# Patient Record
Sex: Female | Born: 1995 | Race: White | Hispanic: No | Smoking: Former smoker
Health system: Southern US, Community
[De-identification: ages and names within clinical notes are randomized; demographics above are authoritative.]

## PROBLEM LIST (undated history)

## (undated) ENCOUNTER — Inpatient Hospital Stay: Payer: Self-pay

## (undated) DIAGNOSIS — R638 Other symptoms and signs concerning food and fluid intake: Secondary | ICD-10-CM

## (undated) DIAGNOSIS — Z8279 Family history of other congenital malformations, deformations and chromosomal abnormalities: Secondary | ICD-10-CM

## (undated) DIAGNOSIS — F419 Anxiety disorder, unspecified: Secondary | ICD-10-CM

## (undated) DIAGNOSIS — F329 Major depressive disorder, single episode, unspecified: Secondary | ICD-10-CM

## (undated) DIAGNOSIS — K589 Irritable bowel syndrome without diarrhea: Secondary | ICD-10-CM

## (undated) DIAGNOSIS — F32A Depression, unspecified: Secondary | ICD-10-CM

## (undated) HISTORY — DX: Major depressive disorder, single episode, unspecified: F32.9

## (undated) HISTORY — DX: Irritable bowel syndrome, unspecified: K58.9

## (undated) HISTORY — DX: Depression, unspecified: F32.A

## (undated) HISTORY — DX: Anxiety disorder, unspecified: F41.9

## (undated) HISTORY — PX: NO PAST SURGERIES: SHX2092

## (undated) HISTORY — DX: Other symptoms and signs concerning food and fluid intake: R63.8

## (undated) HISTORY — DX: Family history of other congenital malformations, deformations and chromosomal abnormalities: Z82.79

## (undated) HISTORY — PX: OTHER SURGICAL HISTORY: SHX169

---
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
## 2013-02-21 ENCOUNTER — Emergency Department: Payer: Self-pay | Admitting: Internal Medicine

## 2013-02-21 LAB — URINALYSIS, COMPLETE
BLOOD: NEGATIVE
Bilirubin,UR: NEGATIVE
Glucose,UR: NEGATIVE mg/dL (ref 0–75)
Ketone: NEGATIVE
Nitrite: NEGATIVE
PROTEIN: NEGATIVE
Ph: 5 (ref 4.5–8.0)
RBC,UR: <1 /HPF (ref 0–5)
Specific Gravity: 1.027 (ref 1.003–1.030)
Squamous Epithelial: 2
WBC UR: 1 /HPF (ref 0–5)

## 2013-02-21 LAB — WET PREP, GENITAL

## 2013-02-21 LAB — PREGNANCY, URINE: Pregnancy Test, Urine: NEGATIVE m[IU]/mL

## 2013-02-27 LAB — HM PAP SMEAR: HM Pap smear: NEGATIVE

## 2014-06-18 NOTE — H&P (Signed)
L&D Evaluation:  History:  HPI 19 yo swf G2P0010 EGA 38.4 weeks admitted in labor with subsequent spontaneous rupture of membranes - clear.   Patient's Medical History IBS; Increased BMI   Patient's Surgical History none   Medications Pre Natal Vitamins  Tylenol (Acetaminophen)   Allergies NKDA   Social History none   Family History Non-Contributory   ROS:  ROS All systems were reviewed.  HEENT, CNS, GI, GU, Respiratory, CV, Renal and Musculoskeletal systems were found to be normal.   Exam:  Vital Signs stable   General no apparent distress   Mental Status clear   Abdomen gravid, non-tender   Estimated Fetal Weight Average for gestational age   Back no CVAT   Edema no edema   Pelvic no external lesions, 7/90/-1/VTX   Mebranes Ruptured   Description clear   FHT Description Variable decelerations   Ucx irregular   Skin dry   Lymph no lymphadenopathy   Other O+/Atb-/NR/RI/VI/HB-/HIV-/Glucola 96/GBS+   Impression:  Impression early labor   Plan:  Plan antibiotics for GBBS prophylaxis, fluids, FSE/IUPC   Electronic Signatures: Babe Anthis, Prentice DockerMartin A (MD)  (Signed 07-Jun-14 08:52)  Authored: L&D Evaluation   Last Updated: 07-Jun-14 08:52 by Pebbles Zeiders, Prentice DockerMartin A (MD)

## 2014-10-03 ENCOUNTER — Encounter: Payer: Self-pay | Admitting: Obstetrics and Gynecology

## 2014-10-03 ENCOUNTER — Ambulatory Visit (INDEPENDENT_AMBULATORY_CARE_PROVIDER_SITE_OTHER): Payer: Medicaid Other | Admitting: Obstetrics and Gynecology

## 2014-10-03 VITALS — BP 113/81 | HR 92 | Ht 61.0 in | Wt 186.4 lb

## 2014-10-03 DIAGNOSIS — R309 Painful micturition, unspecified: Secondary | ICD-10-CM | POA: Diagnosis not present

## 2014-10-03 DIAGNOSIS — N898 Other specified noninflammatory disorders of vagina: Secondary | ICD-10-CM | POA: Diagnosis not present

## 2014-10-03 DIAGNOSIS — N760 Acute vaginitis: Secondary | ICD-10-CM | POA: Insufficient documentation

## 2014-10-03 LAB — POCT URINALYSIS DIPSTICK
Bilirubin, UA: NEGATIVE
Blood, UA: NEGATIVE
Glucose, UA: NEGATIVE
Ketones, UA: NEGATIVE
LEUKOCYTES UA: NEGATIVE
Nitrite, UA: NEGATIVE
PROTEIN UA: NEGATIVE
SPEC GRAV UA: 1.025
Urobilinogen, UA: NEGATIVE
pH, UA: 6

## 2014-10-03 MED ORDER — FLUCONAZOLE 150 MG PO TABS: 150.0000 mg | ORAL_TABLET | Freq: Once | ORAL | Status: DC

## 2014-10-03 NOTE — Progress Notes (Signed)
Patient ID: Angela Mcgrath, female   DOB: 1995/04/06, 19 y.o.   MRN: [id number]  Chief complaint: 1.  Vaginal discharge.    Pt c/o vaginal discharge, white chunky, foul smelling and had red, raw labia, clitoris which has resolved. No recent new sex partners. No itching or odor. No recent antibiotics.  Past medical history, past surgical history, problem list, medications, and allergies are reviewed and updated.  Review of systems: Per HPI.  OBJECTIVE: BP 113/81 mmHg  Pulse 92  Ht  (1.549 m)  Wt 186 lb 7 oz (84.567 kg)  BMI 35.25 kg/m2  LMP 09/22/2014 (Approximate) Well-appearing white female in no acute distress. Abdomen: Soft, nontender. Pelvic exam: External genitalia-normal BUS-normal Vagina-white discharge present; no malodor. Cervix-no lesions Uterus-not palpated. Adnexa-not palpated. Rectovaginal-normal.  External exam  Procedure: Wet prep. KOH-negative for hyphae. Normal saline-negative for clue cells, white blood cells, Trichomonas.  IMPRESSION: 1.  Vaginitis, probably monilial, uncertain wet prep.  PLAN: 1. Diflucan 150 mg by mouth 1 2.  Return as needed if symptoms persist  Herold Harms, MD

## 2015-01-27 ENCOUNTER — Encounter: Payer: Self-pay | Admitting: *Deleted

## 2015-01-27 ENCOUNTER — Ambulatory Visit
Admission: EM | Admit: 2015-01-27 | Discharge: 2015-01-27 | Disposition: A | Discharge status: Home / Self Care | Payer: Medicaid Other | Attending: Emergency Medicine | Admitting: Emergency Medicine

## 2015-01-27 DIAGNOSIS — H6501 Acute serous otitis media, right ear: Secondary | ICD-10-CM

## 2015-01-27 DIAGNOSIS — J0140 Acute pansinusitis, unspecified: Secondary | ICD-10-CM | POA: Diagnosis not present

## 2015-01-27 DIAGNOSIS — H66001 Acute suppurative otitis media without spontaneous rupture of ear drum, right ear: Secondary | ICD-10-CM

## 2015-01-27 MED ORDER — IBUPROFEN 800 MG PO TABS: 800.0000 mg | ORAL_TABLET | Freq: Three times a day (TID) | ORAL | Status: DC | PRN

## 2015-01-27 MED ORDER — AMOXICILLIN-POT CLAVULANATE 875-125 MG PO TABS: 1.0000 | ORAL_TABLET | Freq: Two times a day (BID) | ORAL | Status: DC

## 2015-01-27 MED ORDER — PSEUDOEPHEDRINE-GUAIFENESIN ER 120-1200 MG PO TB12: 1.0000 | ORAL_TABLET | Freq: Two times a day (BID) | ORAL | Status: DC

## 2015-01-27 MED ORDER — MOMETASONE FUROATE 50 MCG/ACT NA SUSP: 2.0000 | Freq: Every day | NASAL | Status: DC

## 2015-01-27 NOTE — ED Provider Notes (Signed)
HPI  SUBJECTIVE:  Angela Mcgrath is a 19 y.o. female who presents with nasal congestion for the past 2 weeks. She states that her upper teeth hurt. She reports a frontal headache which is worse with bending forward, lying down. She also reports right ear pain and inability to hear out of right ear this morning. She reports sore throat, postnasal drip. There are no aggravating or alleviating factors. She tried DayQuil times one several days ago, none since. She denies sinus pain/pressure, otorrhea, foreign body insertion into her ear. She denies ear pain with chewing, popping or cracking in her ear. No nausea, vomiting, fevers. No current nasal drainage. No allergy type symptoms. Past medical history negative for allergies, diabetes, sinusitis, hypertension, otitis media. LMP now.    Past Medical History  Diagnosis Date  . Increased BMI   . IBS (irritable bowel syndrome)   . Family history of Downs syndrome     FOB-brother deceased    Past Surgical History  Procedure Laterality Date  . None      Family History  Problem Relation Age of Onset  . Heart attack Mother     4340's  . COPD Father   . Asthma Father   . Diabetes Maternal Grandmother     Social History  Substance Use Topics  . Smoking status: Former Games developermoker  . Smokeless tobacco: Never Used  . Alcohol Use: 0.0 oz/week    0 Standard drinks or equivalent per week    No current facility-administered medications for this encounter.  Current outpatient prescriptions:  .  amoxicillin-clavulanate (AUGMENTIN) 875-125 MG tablet, Take 1 tablet by mouth 2 (two) times daily. X 10 days, Disp: 20 tablet, Rfl: 0 .  ibuprofen (ADVIL,MOTRIN) 800 MG tablet, Take 1 tablet (800 mg total) by mouth every 8 (eight) hours as needed., Disp: 30 tablet, Rfl: 0 .  mometasone (NASONEX) 50 MCG/ACT nasal spray, Place 2 sprays into the nose daily., Disp: 17 g, Rfl: 0 .  Pseudoephedrine-Guaifenesin (MUCINEX D) 513 200 2480 MG TB12, Take 1 tablet by mouth  2 (two) times daily., Disp: 20 each, Rfl: 0  No Known Allergies   ROS  As noted in HPI.   Physical Exam  BP 111/70 mmHg  Pulse 106  Temp(Src) 99 F (37.2 C) (Oral)  Resp 20  Ht 5\' 1"  (1.549 m)  Wt 170 lb (77.111 kg)  BMI 32.14 kg/m2  SpO2 100%  LMP 01/24/2015  Constitutional: Well developed, well nourished, no acute distress Eyes: PERRL, EOMI, conjunctiva normal bilaterally HENT: Normocephalic, atraumatic,mucus membranes moist. + dull, bulging, erythematous right TM. Positive nasal congestion, swollen, erythematous turbinates, bilateral frontal and maxillary sinus tenderness. Normal oropharynx. Dentition within normal limits. Respiratory: Clear to auscultation bilaterally, no rales, no wheezing, no rhonchi Cardiovascular: Mild tachycardia, no murmurs, no gallops, no rubs GI:  nondistended, Lymph: No cervical lymphadenopathy skin: No rash, skin intact Musculoskeletal: No edema, no tenderness, no deformities Neurologic: Alert & oriented x 3, CN II-XII grossly intact, no motor deficits, sensation grossly intact Psychiatric: Speech and behavior appropriate   ED Course   Medications - No data to display  No orders of the defined types were placed in this encounter.   No results found for this or any previous visit (from the past 24 hour(s)). No results found.  ED Clinical Impression  Acute suppurative otitis media of right ear without spontaneous rupture of tympanic membrane, recurrence not specified  Acute pansinusitis, recurrence not specified   ED Assessment/Plan  LMP now. Patient denies possibility of  being pregnant. Patient with right otitis, also sinusitis 2 weeks duration. Home with Augmentin, Mucinex D, Nasonex, ibuprofen 800 mg, increase fluids. Follow-up with PMD as needed, return here if gets worse.  Discussed  MDM, plan and followup with patient. Discussed sn/sx that should prompt return to the UC or ED. Patient  agrees with plan.  *This clinic note  was created using Dragon dictation software. Therefore, there may be occasional mistakes despite careful proofreading.  ?  Domenick Gong, MD 01/28/15 [phone]

## 2015-01-27 NOTE — ED Notes (Signed)
Patient has had sinus pressure and drainage for 2 weeks and awoke this AM to right side ear pain. She does have some minimal left ear pain. No other symptoms reported.

## 2015-01-27 NOTE — Discharge Instructions (Signed)
Take the medication as written. Return if you get worse, have a fever >100.4, or for any concerns.  Use a neti pot or the NeilMed sinus rinse as often as you want to to reduce nasal congestion. Follow the directions on the box.   Go to www.goodrx.com to look up your medications. This will give you a list of where you can find your prescriptions at the most affordable prices.

## 2015-07-16 ENCOUNTER — Ambulatory Visit (INDEPENDENT_AMBULATORY_CARE_PROVIDER_SITE_OTHER): Payer: Medicaid Other | Admitting: Obstetrics and Gynecology

## 2015-07-16 ENCOUNTER — Encounter: Payer: Self-pay | Admitting: Obstetrics and Gynecology

## 2015-07-16 VITALS — BP 114/72 | HR 93 | Ht 61.0 in | Wt 196.0 lb

## 2015-07-16 DIAGNOSIS — Z0000 Encounter for general adult medical examination without abnormal findings: Secondary | ICD-10-CM | POA: Diagnosis not present

## 2015-07-16 DIAGNOSIS — Z30011 Encounter for initial prescription of contraceptive pills: Secondary | ICD-10-CM

## 2015-07-16 DIAGNOSIS — R638 Other symptoms and signs concerning food and fluid intake: Secondary | ICD-10-CM

## 2015-07-16 DIAGNOSIS — N946 Dysmenorrhea, unspecified: Secondary | ICD-10-CM

## 2015-07-16 DIAGNOSIS — N926 Irregular menstruation, unspecified: Secondary | ICD-10-CM | POA: Diagnosis not present

## 2015-07-16 DIAGNOSIS — Z01419 Encounter for gynecological examination (general) (routine) without abnormal findings: Secondary | ICD-10-CM

## 2015-07-16 LAB — POCT URINE PREGNANCY: Preg Test, Ur: NEGATIVE

## 2015-07-16 MED ORDER — IBUPROFEN 800 MG PO TABS: 800.0000 mg | ORAL_TABLET | Freq: Three times a day (TID) | ORAL | Status: DC | PRN

## 2015-07-16 MED ORDER — NORETHINDRONE ACET-ETHINYL EST 1-20 MG-MCG PO TABS: 1.0000 | ORAL_TABLET | Freq: Every day | ORAL | Status: DC

## 2015-07-16 NOTE — Patient Instructions (Signed)
1. Pap smear is done 2. Self breast exam is encouraged 3. Healthy eating and exercise with weight loss is encouraged 4. Ibuprofen 800 mg 3 times a day is written for dysmenorrhea 5. Microgestin FE 1/20 oral contraceptive is written 6. Return in 1 year

## 2015-07-16 NOTE — Addendum Note (Signed)
Addended by: Marchelle FolksMILLER, Kylani Wires G on: 07/16/2015 03:18 PM   Modules accepted: Orders

## 2015-07-16 NOTE — Progress Notes (Signed)
Patient ID: Angela Mcgrath, female   DOB: December 18, 1995, 20 y.o.   MRN: [id number] ANNUAL PREVENTATIVE CARE GYN  ENCOUNTER NOTE  Subjective:       Angela Mcgrath is a 20 y.o. G39P1011 female here for a routine annual gynecologic exam.  Current complaints: 1.  Cycle 2 weeks late-Urine pregnancy test is negative  2. Yes  wants refill of ibup    Gynecologic History Patient's last menstrual period was 06/03/2015 (exact date). Contraception: none Last Pap: 02/2013. Results were: normal Last mammogram: n/a. Results were: N/A  Obstetric History OB History  Gravida Para Term Preterm AB SAB TAB Ectopic Multiple Living  # Outcome Date GA Lbr Len/2nd Weight Sex Delivery Anes PTL Lv  2 Term 07/15/12    F Vag-Spont   Y  1 SAB 07/2011        FD      Past Medical History  Diagnosis Date  . Increased BMI   . IBS (irritable bowel syndrome)   . Family history of Downs syndrome     FOB-brother deceased  . Anxiety   . Depression     Past Surgical History  Procedure Laterality Date  . None    . No past surgeries      Current Outpatient Prescriptions on File Prior to Visit  Medication Sig Dispense Refill  . ibuprofen (ADVIL,MOTRIN) 800 MG tablet Take 1 tablet (800 mg total) by mouth every 8 (eight) hours as needed. 30 tablet 0   No current facility-administered medications on file prior to visit.    No Known Allergies  Social History   Social History  . Marital Status: Single    Spouse Name: N/A  . Number of Children: N/A  . Years of Education: N/A   Occupational History  . Not on file.   Social History Main Topics  . Smoking status: Former Games developer  . Smokeless tobacco: Never Used  . Alcohol Use: No  . Drug Use: No  . Sexual Activity:    Partners: Male    Birth Control/ Protection: None   Other Topics Concern  . Not on file   Social History Narrative    Family History  Problem Relation Age of Onset  . Heart attack Mother     39's  . Diabetes  Mother   . COPD Father   . Asthma Father   . Diabetes Maternal Grandmother   . Heart disease Maternal Grandmother   . Diabetes Maternal Grandfather   . Colon cancer Neg Hx   . Ovarian cancer Neg Hx   . Breast cancer Neg Hx     The following portions of the patient's history were reviewed and updated as appropriate: allergies, current medications, past family history, past medical history, past social history, past surgical history and problem list.  Review of Systems ROS Review of Systems - General ROS: negative for - chills, fatigue, fever, hot flashes, night sweats, weight gain or weight loss Psychological ROS: negative for - anxiety, decreased libido, depression, mood swings, physical abuse or sexual abuse Ophthalmic ROS: negative for - blurry vision, eye pain or loss of vision ENT ROS: negative for - headaches, hearing change, visual changes or vocal changes Allergy and Immunology ROS: negative for - hives, itchy/watery eyes or seasonal allergies Hematological and Lymphatic ROS: negative for - bleeding problems, bruising, swollen lymph nodes or weight loss Endocrine ROS: negative for - galactorrhea, hair pattern changes, hot  flashes, malaise/lethargy, mood swings, palpitations, polydipsia/polyuria, skin changes, temperature intolerance or unexpected weight changes Breast ROS: negative for - new or changing breast lumps or nipple discharge Respiratory ROS: negative for - cough or shortness of breath Cardiovascular ROS: negative for - chest pain, irregular heartbeat, palpitations or shortness of breath Gastrointestinal ROS: no abdominal pain, change in bowel habits, or black or bloody stools Genito-Urinary ROS: no dysuria, trouble voiding, or hematuria Musculoskeletal ROS: negative for - joint pain or joint stiffness Neurological ROS: negative for - bowel and bladder control changes Dermatological ROS: negative for rash and skin lesion changes   Objective:   BP 114/72 mmHg  Pulse  93  Ht 5\' 1"  (1.549 m)  Wt 196 lb (88.905 kg)  BMI 37.05 kg/m2  LMP 06/03/2015 (Exact Date) CONSTITUTIONAL: Well-developed, well-nourished female in no acute distress.  PSYCHIATRIC: Normal mood and affect. Normal behavior. Normal judgment and thought content. NEUROLGIC: Alert and oriented to person, place, and time. Normal muscle tone coordination. No cranial nerve deficit noted. HENT:  Normocephalic, atraumatic, External right and left ear normal. Oropharynx is clear and moist EYES: Conjunctivae and EOM are normal. Pupils are equal, round, and reactive to light. No scleral icterus.  NECK: Normal range of motion, supple, no masses.  Normal thyroid.  SKIN: Skin is warm and dry. No rash noted. Not diaphoretic. No erythema. No pallor. CARDIOVASCULAR: Normal heart rate noted, regular rhythm, no murmur. RESPIRATORY: Clear to auscultation bilaterally. Effort and breath sounds normal, no problems with respiration noted. BREASTS: Symmetric in size. No masses, skin changes, nipple drainage, or lymphadenopathy. ABDOMEN: Soft, normal bowel sounds, no distention noted.  No tenderness, rebound or guarding.  BLADDER: Normal PELVIC:  External Genitalia: Normal  BUS: Normal  Vagina: Normal  Cervix: Normal  Uterus: Normal  Adnexa: Normal  RV: External Exam NormaI  MUSCULOSKELETAL: Normal range of motion. No tenderness.  No cyanosis, clubbing, or edema.  2+ distal pulses. LYMPHATIC: No Axillary, Supraclavicular, or Inguinal Adenopathy.    Assessment:   Annual gynecologic examination 20 y.o. Contraception: none patient given prescription for Microgestin FE 1/20 bmi- 37 Dysmenorrhea   Plan:  Pap: Pap, Reflex if ASCUS and GC/CT NAAT Mammogram: Not Indicated Stool Guaiac Testing:  Not Indicated Labs: vit d lipid a1c fbs tsh Routine preventative health maintenance measures emphasized: Exercise/Diet/Weight control, Tobacco Warnings, Alcohol/Substance use risks and Safe Sex Refill Motrin 800 mg 3  times a day Microgestin FE1/20 prescription given Return to Clinic - 1 Year   Crystal GraysonMiller, CMA  Herold HarmsMartin A Defrancesco, MD  Note: This dictation was prepared with Dragon dictation along with smaller phrase technology. Any transcriptional errors that result from this process are unintentional.

## 2015-07-21 LAB — PAP IG, CT-NG, RFX HPV ASCU
CHLAMYDIA, NUC. ACID AMP: NEGATIVE
Gonococcus by Nucleic Acid Amp: NEGATIVE
PAP Smear Comment: 0

## 2016-01-20 ENCOUNTER — Emergency Department
Admission: EM | Admit: 2016-01-20 | Discharge: 2016-01-20 | Disposition: A | Discharge status: Home / Self Care | Payer: Medicaid Other | Attending: Emergency Medicine | Admitting: Emergency Medicine

## 2016-01-20 DIAGNOSIS — Z791 Long term (current) use of non-steroidal anti-inflammatories (NSAID): Secondary | ICD-10-CM | POA: Insufficient documentation

## 2016-01-20 DIAGNOSIS — Z5321 Procedure and treatment not carried out due to patient leaving prior to being seen by health care provider: Secondary | ICD-10-CM | POA: Diagnosis not present

## 2016-01-20 DIAGNOSIS — M545 Low back pain: Secondary | ICD-10-CM | POA: Diagnosis present

## 2016-01-20 DIAGNOSIS — Z87891 Personal history of nicotine dependence: Secondary | ICD-10-CM | POA: Diagnosis not present

## 2016-01-20 NOTE — ED Triage Notes (Signed)
Pt presents to ED with c/o bilateral lower back pain x2 days without any known recent injury or trauma. Pt reports "achy" pain, without radiation. Pt denies any change in bowel or bladder habits. Pt denies any h/x of kidney stones., LMP currently.

## 2016-01-20 NOTE — ED Notes (Signed)
No answer when called from lobby 

## 2016-04-19 ENCOUNTER — Encounter: Payer: Self-pay | Admitting: Emergency Medicine

## 2016-04-19 ENCOUNTER — Emergency Department
Admission: EM | Admit: 2016-04-19 | Discharge: 2016-04-19 | Disposition: A | Discharge status: Home / Self Care | Payer: Medicaid Other | Attending: Emergency Medicine | Admitting: Emergency Medicine

## 2016-04-19 DIAGNOSIS — Z87891 Personal history of nicotine dependence: Secondary | ICD-10-CM | POA: Insufficient documentation

## 2016-04-19 DIAGNOSIS — J029 Acute pharyngitis, unspecified: Secondary | ICD-10-CM | POA: Insufficient documentation

## 2016-04-19 LAB — POCT RAPID STREP A: Streptococcus, Group A Screen (Direct): NEGATIVE

## 2016-04-19 MED ORDER — LIDOCAINE VISCOUS 2 % MT SOLN
15.0000 mL | Freq: Once | OROMUCOSAL | Status: AC
  Administered 2016-04-19: 15 mL via OROMUCOSAL
  Filled 2016-04-19: qty 15

## 2016-04-19 MED ORDER — IBUPROFEN 600 MG PO TABS
600.0000 mg | ORAL_TABLET | Freq: Once | ORAL | Status: AC
  Administered 2016-04-19: 600 mg via ORAL
  Filled 2016-04-19: qty 1

## 2016-04-19 MED ORDER — DEXAMETHASONE SODIUM PHOSPHATE 10 MG/ML IJ SOLN
INTRAMUSCULAR | Status: AC
  Filled 2016-04-19: qty 1

## 2016-04-19 MED ORDER — DEXAMETHASONE 10 MG/ML FOR PEDIATRIC ORAL USE
10.0000 mg | Freq: Once | INTRAMUSCULAR | Status: AC
  Administered 2016-04-19: 10 mg via ORAL

## 2016-04-19 NOTE — ED Provider Notes (Signed)
[facility] Emergency Department Provider Note   ____________________________________________   First MD Initiated Contact with Patient 04/19/16 0150     (approximate)  I have reviewed the triage vital signs and the nursing notes.   HISTORY  Chief Complaint Sore Throat    HPI Angela Mcgrath is a 21 y.o. female who comes into the hospital today complaining that her throat is swollen. She reports that it started about 1-2 days ago. She hasn't taken anything for the symptoms reports that sometimes her throat gets dry and she feels it is difficult to breathe. She reports that she felt that before she came in. The patient has not had any fever but has had some runny nose and cough. The patient denies any sick contacts and she has some occasional nausea with no vomiting. The patient has had some abdominal pain earlier but she reports that she thought it was due to her menstrual cycle. The patient denies any headache but rates her pain a 6 out of 10 in intensity. The patient's boyfriend reports that a month ago he was diagnosed with mono and was told that he was contagious for up to 18 months. The patient is here for evaluation.   Past Medical History:  Diagnosis Date  . Anxiety   . Depression   . Family history of Downs syndrome    FOB-brother deceased  . IBS (irritable bowel syndrome)   . Increased BMI     Patient Active Problem List   Diagnosis Date Noted  . Encounter for initial prescription of contraceptive pills 07/16/2015  . Dysmenorrhea 07/16/2015  . Missed period 07/16/2015  . Vaginitis 10/03/2014    Past Surgical History:  Procedure Laterality Date  . NO PAST SURGERIES    . none      Prior to Admission medications   Medication Sig Start Date End Date Taking? Authorizing Provider  ibuprofen (ADVIL,MOTRIN) 800 MG tablet Take 1 tablet (800 mg total) by mouth every 8 (eight) hours as needed. 07/16/15  Yes Herold HarmsMartin A Defrancesco, MD     Allergies Patient has no known allergies.  Family History  Problem Relation Age of Onset  . Heart attack Mother     740's  . Diabetes Mother   . COPD Father   . Asthma Father   . Diabetes Maternal Grandmother   . Heart disease Maternal Grandmother   . Diabetes Maternal Grandfather   . Colon cancer Neg Hx   . Ovarian cancer Neg Hx   . Breast cancer Neg Hx     Social History Social History  Substance Use Topics  . Smoking status: Former Games developermoker  . Smokeless tobacco: Never Used  . Alcohol use No    Review of Systems Constitutional: No fever/chills Eyes: No visual changes. ENT:  sore throat And runny nose Cardiovascular: Denies chest pain. Respiratory: Cough and shortness of breath Gastrointestinal: No abdominal pain.  No nausea, no vomiting.  No diarrhea.  No constipation. Genitourinary: Negative for dysuria. Musculoskeletal: Negative for back pain. Skin: Negative for rash. Neurological: Negative for headaches, focal weakness or numbness.  10-point ROS otherwise negative.  ____________________________________________   PHYSICAL EXAM:  VITAL SIGNS: ED Triage Vitals  Enc Vitals Group     BP 04/19/16 0111 (!) 143/71     Pulse Rate 04/19/16 0111 (!) 102     Resp 04/19/16 0111 18     Temp 04/19/16 0111 98.7 F (37.1 C)     Temp Source 04/19/16 0111 Oral  SpO2 04/19/16 0111 98 %     Weight 04/19/16 0112 178 lb (80.7 kg)     Height 04/19/16 0112 5' (1.524 m)     Head Circumference --      Peak Flow --      Pain Score 04/19/16 0114 6     Pain Loc --      Pain Edu? --      Excl. in GC? --     Constitutional: Alert and oriented. Well appearing and in Mild distress. Eyes: Conjunctivae are normal. PERRL. EOMI. Head: Atraumatic. Nose: No congestion/rhinnorhea. Mouth/Throat: Mucous membranes are moist.  Oropharynx non-erythematous. Lymphatic: no cervical lymphadenopathy Cardiovascular: Normal rate, regular rhythm. Grossly normal heart sounds.  Good  peripheral circulation. Respiratory: Normal respiratory effort.  No retractions. Lungs CTAB. Gastrointestinal: Soft and nontender. No distention. Active bowel sounds Musculoskeletal: No lower extremity tenderness nor edema.   Neurologic:  Normal speech and language.  Skin:  Skin is warm, dry and intact.  Psychiatric: Mood and affect are normal.   ____________________________________________   LABS (all labs ordered are listed, but only abnormal results are displayed)  Labs Reviewed  POCT RAPID STREP A   ____________________________________________  EKG  none ____________________________________________  RADIOLOGY  none ____________________________________________   PROCEDURES  Procedure(s) performed: None  Procedures  Critical Care performed: No  ____________________________________________   INITIAL IMPRESSION / ASSESSMENT AND PLAN / ED COURSE  Pertinent labs & imaging results that were available during my care of the patient were reviewed by me and considered in my medical decision making (see chart for details).  This is a 21 year old female who comes into the hospital today with a sore throat. The patient's strep test is negative. We'll send her strep swab for culture, we'll give the patient some dexamethasone, viscous lidocaine and ibuprofen. She'll be discharged home to follow-up with her primary care physician. The patient's boyfriend was concerned about mono. I informed him and her that it would be supportive care but I am willing to test her for mono. The patient reports that she does not want to be tested at this time since there is no medication that would help treat the symptoms. She will be discharged home.      ____________________________________________   FINAL CLINICAL IMPRESSION(S) / ED DIAGNOSES  Final diagnoses:  Sore throat  Viral pharyngitis      NEW MEDICATIONS STARTED DURING THIS VISIT:  New Prescriptions   No medications on  file     Note:  This document was prepared using Dragon voice recognition software and may include unintentional dictation errors.    Rebecka Apley, MD 04/19/16 [phone]

## 2016-04-19 NOTE — ED Triage Notes (Signed)
Pt reports cough and congestion with intermittent sore throat; says sometimes her throat is dry and sometimes it feels painful and swollen; pt talking in complete coherent sentences; able to swallow without difficulty

## 2016-04-21 ENCOUNTER — Encounter: Payer: Self-pay | Admitting: Medical Oncology

## 2016-04-21 ENCOUNTER — Emergency Department: Payer: Medicaid Other

## 2016-04-21 ENCOUNTER — Emergency Department
Admission: EM | Admit: 2016-04-21 | Discharge: 2016-04-21 | Disposition: A | Discharge status: Home / Self Care | Payer: Medicaid Other | Attending: Emergency Medicine | Admitting: Emergency Medicine

## 2016-04-21 DIAGNOSIS — Z87891 Personal history of nicotine dependence: Secondary | ICD-10-CM | POA: Diagnosis not present

## 2016-04-21 DIAGNOSIS — R109 Unspecified abdominal pain: Secondary | ICD-10-CM

## 2016-04-21 DIAGNOSIS — N3001 Acute cystitis with hematuria: Secondary | ICD-10-CM | POA: Insufficient documentation

## 2016-04-21 LAB — COMPREHENSIVE METABOLIC PANEL
ALBUMIN: 3.8 g/dL (ref 3.5–5.0)
ALK PHOS: 42 U/L (ref 38–126)
ALT: 11 U/L — ABNORMAL LOW (ref 14–54)
ANION GAP: 6 (ref 5–15)
AST: 17 U/L (ref 15–41)
BUN: 9 mg/dL (ref 6–20)
CALCIUM: 8.7 mg/dL — AB (ref 8.9–10.3)
CO2: 28 mmol/L (ref 22–32)
Chloride: 104 mmol/L (ref 101–111)
Creatinine, Ser: 0.54 mg/dL (ref 0.44–1.00)
GFR calc non Af Amer: >60 mL/min (ref >60)
GLUCOSE: 86 mg/dL (ref 65–99)
POTASSIUM: 4 mmol/L (ref 3.5–5.1)
SODIUM: 138 mmol/L (ref 135–145)
TOTAL PROTEIN: 7 g/dL (ref 6.5–8.1)
Total Bilirubin: 0.7 mg/dL (ref 0.3–1.2)

## 2016-04-21 LAB — URINALYSIS, COMPLETE (UACMP) WITH MICROSCOPIC
BACTERIA UA: NONE SEEN
Bilirubin Urine: NEGATIVE
Glucose, UA: NEGATIVE mg/dL
Ketones, ur: NEGATIVE mg/dL
NITRITE: NEGATIVE
Protein, ur: NEGATIVE mg/dL
SPECIFIC GRAVITY, URINE: 1.006 (ref 1.005–1.030)
pH: 7 (ref 5.0–8.0)

## 2016-04-21 LAB — CBC
HCT: 40.8 % (ref 35.0–47.0)
HEMOGLOBIN: 13.4 g/dL (ref 12.0–16.0)
MCH: 28.6 pg (ref 26.0–34.0)
MCHC: 32.9 g/dL (ref 32.0–36.0)
MCV: 86.9 fL (ref 80.0–100.0)
PLATELETS: 273 10*3/uL (ref 150–440)
RBC: 4.7 MIL/uL (ref 3.80–5.20)
RDW: 13.8 % (ref 11.5–14.5)
WBC: 11.7 10*3/uL — ABNORMAL HIGH (ref 3.6–11.0)

## 2016-04-21 LAB — LIPASE, BLOOD: Lipase: 11 U/L (ref 11–51)

## 2016-04-21 LAB — POCT PREGNANCY, URINE: PREG TEST UR: NEGATIVE

## 2016-04-21 MED ORDER — CEPHALEXIN 500 MG PO CAPS: 500.0000 mg | ORAL_CAPSULE | Freq: Two times a day (BID) | ORAL | 0 refills | Status: DC

## 2016-04-21 NOTE — ED Triage Notes (Signed)
Pt reports that she began having rt upper quad abd pain today around 1300. Pt denies nausea, vomiting. Pt denies fever.

## 2016-04-21 NOTE — ED Provider Notes (Signed)
[facility] Emergency Department Provider Note   ____________________________________________    I have reviewed the triage vital signs and the nursing notes.   HISTORY  Chief Complaint Abdominal Pain     HPI Angela Mcgrath is a 21 y.o. female who presents with complaints of right-sided abdominal pain which started earlier today. She complains of urinary frequency but no dysuria. No fevers or chills reported. No history of abdominal surgery. No vaginal discharge. Pain is cramping and intermittent, she has not taken anything for it. She has never had this pain before   Past Medical History:  Diagnosis Date  . Anxiety   . Depression   . Family history of Downs syndrome    FOB-brother deceased  . IBS (irritable bowel syndrome)   . Increased BMI     Patient Active Problem List   Diagnosis Date Noted  . Encounter for initial prescription of contraceptive pills 07/16/2015  . Dysmenorrhea 07/16/2015  . Missed period 07/16/2015  . Vaginitis 10/03/2014    Past Surgical History:  Procedure Laterality Date  . NO PAST SURGERIES    . none      Prior to Admission medications   Medication Sig Start Date End Date Taking? Authorizing Provider  cephALEXin (KEFLEX) 500 MG capsule Take 1 capsule (500 mg total) by mouth 2 (two) times daily. 04/21/16   Jene Every, MD  ibuprofen (ADVIL,MOTRIN) 800 MG tablet Take 1 tablet (800 mg total) by mouth every 8 (eight) hours as needed. 07/16/15   Herold Harms, MD     Allergies Patient has no known allergies.  Family History  Problem Relation Age of Onset  . Heart attack Mother     71's  . Diabetes Mother   . COPD Father   . Asthma Father   . Diabetes Maternal Grandmother   . Heart disease Maternal Grandmother   . Diabetes Maternal Grandfather   . Colon cancer Neg Hx   . Ovarian cancer Neg Hx   . Breast cancer Neg Hx     Social History Social History  Substance Use Topics  . Smoking status:  Former Games developer  . Smokeless tobacco: Never Used  . Alcohol use No    Review of Systems  Constitutional: No fever/chills  ENT: Mild sore throat  Gastrointestinal: As above Genitourinary: Negative for dysuria. Musculoskeletal: Negative for back pain. Skin: Negative for rash. Neurological: Negative for headaches   10-point ROS otherwise negative.  ____________________________________________   PHYSICAL EXAM:  VITAL SIGNS: ED Triage Vitals  Enc Vitals Group     BP 04/21/16 1713 119/76     Pulse Rate 04/21/16 1713 85     Resp 04/21/16 1713 17     Temp 04/21/16 1713 98.4 F (36.9 C)     Temp Source 04/21/16 1713 Oral     SpO2 04/21/16 1713 97 %     Weight 04/21/16 1715 178 lb (80.7 kg)     Height 04/21/16 1715 5' (1.524 m)     Head Circumference --      Peak Flow --      Pain Score 04/21/16 1715 6     Pain Loc --      Pain Edu? --      Excl. in GC? --     Constitutional: Alert and oriented. No acute distress. Pleasant and interactive Eyes: Conjunctivae are normal.   Nose: No congestion/rhinnorhea. Mouth/Throat: Mucous membranes are moist.   Cardiovascular: Normal rate, regular rhythm. Grossly normal heart sounds.  Good peripheral circulation. Respiratory: Normal respiratory effort.  No retractions. Lungs CTAB. Gastrointestinal:Mild tenderness in the right lower quadrant. No distention.  No CVA tenderness. Genitourinary: deferred Musculoskeletal: No lower extremity tenderness nor edema.  Warm and well perfused Neurologic:  Normal speech and language. No gross focal neurologic deficits are appreciated.  Skin:  Skin is warm, dry and intact. No rash noted. Psychiatric: Mood and affect are normal. Speech and behavior are normal.  ____________________________________________   LABS (all labs ordered are listed, but only abnormal results are displayed)  Labs Reviewed  CBC - Abnormal; Notable for the following:       Result Value   WBC 11.7 (*)    All other  components within normal limits  COMPREHENSIVE METABOLIC PANEL - Abnormal; Notable for the following:    Calcium 8.7 (*)    ALT 11 (*)    All other components within normal limits  URINALYSIS, COMPLETE (UACMP) WITH MICROSCOPIC - Abnormal; Notable for the following:    Color, Urine STRAW (*)    APPearance HAZY (*)    Hgb urine dipstick SMALL (*)    Leukocytes, UA LARGE (*)    Squamous Epithelial / LPF 6-30 (*)    All other components within normal limits  LIPASE, BLOOD  POCT PREGNANCY, URINE   ____________________________________________  EKG  None ____________________________________________  RADIOLOGY  CT abdomen pelvis unremarkable ____________________________________________   PROCEDURES  Procedure(s) performed: No    Critical Care performed: No ____________________________________________   INITIAL IMPRESSION / ASSESSMENT AND PLAN / ED COURSE  Pertinent labs & imaging results that were available during my care of the patient were reviewed by me and considered in my medical decision making (see chart for details).  Patient well-appearing and in no acute distress. She does have some mild right lower quadrant tenderness to palpation however symptoms are suggestive of urinary tract infection. Her urinalysis does show some blood we will obtain CT to rule out kidney stone.    CT scan is reassuring, patient feels well we will treat with Keflex by mouth 7 days. Return precautions discussed ____________________________________________   FINAL CLINICAL IMPRESSION(S) / ED DIAGNOSES  Final diagnoses:  Acute right flank pain  Acute cystitis with hematuria      NEW MEDICATIONS STARTED DURING THIS VISIT:  New Prescriptions   CEPHALEXIN (KEFLEX) 500 MG CAPSULE    Take 1 capsule (500 mg total) by mouth 2 (two) times daily.     Note:  This document was prepared using Dragon voice recognition software and may include unintentional dictation errors.    Jene Everyobert  Breslin Burklow, MD 04/21/16 [id number]

## 2016-04-26 ENCOUNTER — Other Ambulatory Visit: Payer: Self-pay

## 2016-04-26 ENCOUNTER — Encounter: Payer: Self-pay | Admitting: Obstetrics and Gynecology

## 2016-04-26 ENCOUNTER — Ambulatory Visit (INDEPENDENT_AMBULATORY_CARE_PROVIDER_SITE_OTHER): Payer: Medicaid Other | Admitting: Obstetrics and Gynecology

## 2016-04-26 VITALS — BP 102/63 | HR 73 | Wt 178.1 lb

## 2016-04-26 DIAGNOSIS — B3731 Acute candidiasis of vulva and vagina: Secondary | ICD-10-CM

## 2016-04-26 DIAGNOSIS — R300 Dysuria: Secondary | ICD-10-CM

## 2016-04-26 DIAGNOSIS — B373 Candidiasis of vulva and vagina: Secondary | ICD-10-CM

## 2016-04-26 LAB — POCT URINALYSIS DIPSTICK
Bilirubin, UA: NEGATIVE
Blood, UA: NEGATIVE
Glucose, UA: NEGATIVE
KETONES UA: NEGATIVE
LEUKOCYTES UA: NEGATIVE
Nitrite, UA: NEGATIVE
PH UA: 5 (ref 5.0–8.0)
PROTEIN UA: NEGATIVE
SPEC GRAV UA: 1.025 (ref 1.030–1.035)
Urobilinogen, UA: NEGATIVE (ref ?–2.0)

## 2016-04-26 MED ORDER — FLUCONAZOLE 150 MG PO TABS: 150.0000 mg | ORAL_TABLET | ORAL | 0 refills | Status: DC

## 2016-04-26 NOTE — Progress Notes (Signed)
HPI:      [name] is a 21 y.o. G2P1011 who LMP was Patient's last menstrual period was 04/18/2016 (exact date).  Subjective:   She presents today  After being seen in the emergency department last week and diagnosed with a UTI. She has been taking Keflex reports that she continues to experience worsening swelling of her labia in addition to burning with urination.  Specifically when the urine touches the labia. She reports that she has no vulvar or vaginal sores or lesions.    Hx: The following portions of the patient's history were reviewed and updated as appropriate:              She  has a past medical history of Anxiety; Depression; Family history of Downs syndrome; IBS (irritable bowel syndrome); and Increased BMI. She  does not have any pertinent problems on file. She  has a past surgical history that includes none and No past surgeries. Her family history includes Asthma in her father; COPD in her father; Diabetes in her maternal grandfather, maternal grandmother, and mother; Heart attack in her mother; Heart disease in her maternal grandmother. She  reports that she has quit smoking. She has never used smokeless tobacco. She reports that she does not drink alcohol or use drugs. Current Outpatient Prescriptions on File Prior to Visit  Medication Sig Dispense Refill  . cephALEXin (KEFLEX) 500 MG capsule Take 1 capsule (500 mg total) by mouth 2 (two) times daily. 14 capsule 0  . ibuprofen (ADVIL,MOTRIN) 800 MG tablet Take 1 tablet (800 mg total) by mouth every 8 (eight) hours as needed. 30 tablet 0   No current facility-administered medications on file prior to visit.          Review of Systems:  Review of Systems  Constitutional: Denied constitutional symptoms, night sweats, recent illness, fatigue, fever, insomnia and weight loss.  Eyes: Denied eye symptoms, eye pain, photophobia, vision change and visual disturbance.  Ears/Nose/Throat/Neck: Denied ear, nose, throat or  neck symptoms, hearing loss, nasal discharge, sinus congestion and sore throat.  Cardiovascular: Denied cardiovascular symptoms, arrhythmia, chest pain/pressure, edema, exercise intolerance, orthopnea and palpitations.  Respiratory: Denied pulmonary symptoms, asthma, pleuritic pain, productive sputum, cough, dyspnea and wheezing.  Gastrointestinal: Denied, gastro-esophageal reflux, melena, nausea and vomiting.  Genitourinary: See HPI for additional information.  Musculoskeletal: Denied musculoskeletal symptoms, stiffness, swelling, muscle weakness and myalgia.  Dermatologic: Denied dermatology symptoms, rash and scar.  Neurologic: Denied neurology symptoms, dizziness, headache, neck pain and syncope.  Psychiatric: Denied psychiatric symptoms, anxiety and depression.  Endocrine: Denied endocrine symptoms including hot flashes and night sweats.   Meds:   Current Outpatient Prescriptions on File Prior to Visit  Medication Sig Dispense Refill  . cephALEXin (KEFLEX) 500 MG capsule Take 1 capsule (500 mg total) by mouth 2 (two) times daily. 14 capsule 0  . ibuprofen (ADVIL,MOTRIN) 800 MG tablet Take 1 tablet (800 mg total) by mouth every 8 (eight) hours as needed. 30 tablet 0   No current facility-administered medications on file prior to visit.     Objective:     Vitals:   04/26/16 1445  BP: 102/63  Pulse: 73              Physical examination   Pelvic:   Vulva: Normal appearance.  No lesions.  Vagina: No lesions or abnormalities noted.  Support: Normal pelvic support.  Urethra No masses tenderness or scarring.  Meatus Normal size without lesions or prolapse.  Cervix: Normal  appearance.  No lesions.  Anus: Normal exam.  No lesions.  Perineum: Normal exam.  No lesions.        Bimanual   Uterus: Normal size.  Non-tender.  Mobile.  AV.  Adnexae: No masses.  Non-tender to palpation.  Cul-de-sac: Negative for abnormality.   U/A - Negative  WET PREP: clue cells: absent, KOH  (yeast): positive, odor: absent and trichomoniasis: negative Ph:  < 4.5   Assessment:    G2P1011 Patient Active Problem List   Diagnosis Date Noted  . Encounter for initial prescription of contraceptive pills 07/16/2015  . Dysmenorrhea 07/16/2015  . Missed period 07/16/2015  . Vaginitis 10/03/2014     1. Dysuria   2. Monilial vulvovaginitis     Patient with monilial likely secondary to Keflex use. No lesions or evidence of HSV.   Plan:            1.  Diflucan   She is to contact us if her symptoms become worse.   Orders Orders Placed This Encounter  Procedures  . POCT urinalysis dipstick     Meds ordered this encounter  Medications  . fluconazole (DIFLUCAN) 150 MG tablet    Sig: Take 1 tablet (150 mg total) by mouth once a week. For three doses    Dispense:  2 tablet    Refill:  0        F/U  Return for Pt to contact us if symptoms worsen.  Elonda Husky, M.D. 04/26/2016 2:56 PM

## 2016-04-27 ENCOUNTER — Other Ambulatory Visit: Payer: Self-pay

## 2016-04-27 DIAGNOSIS — B3731 Acute candidiasis of vulva and vagina: Secondary | ICD-10-CM

## 2016-04-27 DIAGNOSIS — B373 Candidiasis of vulva and vagina: Secondary | ICD-10-CM

## 2016-04-27 MED ORDER — FLUCONAZOLE 150 MG PO TABS: 150.0000 mg | ORAL_TABLET | ORAL | 0 refills | Status: DC

## 2016-04-28 ENCOUNTER — Telehealth: Payer: Self-pay | Admitting: *Deleted

## 2016-04-28 NOTE — Telephone Encounter (Signed)
Patient called and states that she seen the Dr a few days ago and he prescribed her Diflucan. The pharmacy is needing clarification on the dosage. She states Dr Clayburn PertEvan told her 1 tablet once a week for 2wks and the pharmacy labeled It on her bottle as 1 tablet once a week for 3wks but gave her 2 tablets. Needing clarification. Her [facility] is [facility] in [place] . Patient states an you call her when it is fixed. Please advise.

## 2016-04-28 NOTE — Telephone Encounter (Signed)
Called [facility]- advised the script was escribed with the correct instructions on 04/27/16. Spoke with Nicki who states she found the corrected information. Informed patient script has been corrected and will be ready for pickup.

## 2016-07-19 NOTE — Progress Notes (Deleted)
Patient ID: Angela Mcgrath, female   DOB: January 20, 1996, 21 y.o.   MRN: [id number] ANNUAL PREVENTATIVE CARE GYN  ENCOUNTER NOTE  Subjective:       Angela PiqueStormie D Raschke is a 21 y.o. 742P1011 female here for a routine annual gynecologic exam.  Current complaints: 1.    Gynecologic History No LMP recorded. Contraception: none Last Pap: 07/2015 neg/neg/neg. Results were: normal Last mammogram: n/a. Results were: N/A  Obstetric History OB History  Gravida Para Term Preterm AB Living  2 1 1   1 1   SAB TAB Ectopic Multiple Live Births  1       1    # Outcome Date GA Lbr Len/2nd Weight Sex Delivery Anes PTL Lv  2 Term 07/15/12    F Vag-Spont   LIV  1 SAB 07/2011        FD      Past Medical History:  Diagnosis Date  . Anxiety   . Depression   . Family history of Downs syndrome    FOB-brother deceased  . IBS (irritable bowel syndrome)   . Increased BMI     Past Surgical History:  Procedure Laterality Date  . NO PAST SURGERIES    . none      Current Outpatient Prescriptions on File Prior to Visit  Medication Sig Dispense Refill  . cephALEXin (KEFLEX) 500 MG capsule Take 1 capsule (500 mg total) by mouth 2 (two) times daily. 14 capsule 0  . fluconazole (DIFLUCAN) 150 MG tablet Take 1 tablet (150 mg total) by mouth once a week. For two doses 2 tablet 0  . ibuprofen (ADVIL,MOTRIN) 800 MG tablet Take 1 tablet (800 mg total) by mouth every 8 (eight) hours as needed. 30 tablet 0   No current facility-administered medications on file prior to visit.     No Known Allergies  Social History   Social History  . Marital status: Single    Spouse name: N/A  . Number of children: N/A  . Years of education: N/A   Occupational History  . Not on file.   Social History Main Topics  . Smoking status: Former Games developermoker  . Smokeless tobacco: Never Used  . Alcohol use No  . Drug use: No  . Sexual activity: Yes    Partners: Male    Birth control/ protection: None   Other Topics Concern  .  Not on file   Social History Narrative  . No narrative on file    Family History  Problem Relation Age of Onset  . Heart attack Mother        7140's  . Diabetes Mother   . COPD Father   . Asthma Father   . Diabetes Maternal Grandmother   . Heart disease Maternal Grandmother   . Diabetes Maternal Grandfather   . Colon cancer Neg Hx   . Ovarian cancer Neg Hx   . Breast cancer Neg Hx     The following portions of the patient's history were reviewed and updated as appropriate: allergies, current medications, past family history, past medical history, past social history, past surgical history and problem list.  Review of Systems ROS  Objective:   There were no vitals taken for this visit. CONSTITUTIONAL: Well-developed, well-nourished female in no acute distress.  PSYCHIATRIC: Normal mood and affect. Normal behavior. Normal judgment and thought content. NEUROLGIC: Alert and oriented to person, place, and time. Normal muscle tone coordination. No cranial nerve deficit noted. HENT:  Normocephalic, atraumatic, External right and  left ear normal. Oropharynx is clear and moist EYES: Conjunctivae and EOM are normal. Pupils are equal, round, and reactive to light. No scleral icterus.  NECK: Normal range of motion, supple, no masses.  Normal thyroid.  SKIN: Skin is warm and dry. No rash noted. Not diaphoretic. No erythema. No pallor. CARDIOVASCULAR: Normal heart rate noted, regular rhythm, no murmur. RESPIRATORY: Clear to auscultation bilaterally. Effort and breath sounds normal, no problems with respiration noted. BREASTS: Symmetric in size. No masses, skin changes, nipple drainage, or lymphadenopathy. ABDOMEN: Soft, normal bowel sounds, no distention noted.  No tenderness, rebound or guarding.  BLADDER: Normal PELVIC:  External Genitalia: Normal  BUS: Normal  Vagina: Normal  Cervix: Normal  Uterus: Normal  Adnexa: Normal  RV: External Exam NormaI  MUSCULOSKELETAL: Normal range of  motion. No tenderness.  No cyanosis, clubbing, or edema.  2+ distal pulses. LYMPHATIC: No Axillary, Supraclavicular, or Inguinal Adenopathy.    Assessment:   Annual gynecologic examination 21 y.o. Contraception: none patient given prescription for Microgestin FE 1/20 bmi- 37 Dysmenorrhea   Plan:  Pap: Pap, Reflex if ASCUS and GC/CT NAAT Mammogram: Not Indicated Stool Guaiac Testing:  Not Indicated Labs: Fbs, a1c, lipid, tsh Routine preventative health maintenance measures emphasized: Exercise/Diet/Weight control, Tobacco Warnings, Alcohol/Substance use risks and Safe Sex Microgestin FE1/20 prescription given Return to Clinic - 1 Year   [place], [place]   Note: This dictation was prepared with Dragon dictation along with smaller phrase technology. Any transcriptional errors that result from this process are unintentional.

## 2016-07-20 ENCOUNTER — Encounter: Payer: Medicaid Other | Admitting: Obstetrics and Gynecology

## 2016-07-27 ENCOUNTER — Encounter: Payer: Medicaid Other | Admitting: Obstetrics and Gynecology

## 2017-01-16 ENCOUNTER — Other Ambulatory Visit: Payer: Self-pay | Admitting: Obstetrics and Gynecology

## 2017-01-18 MED ORDER — IBUPROFEN 800 MG PO TABS: 800.0000 mg | ORAL_TABLET | Freq: Three times a day (TID) | ORAL | 0 refills | Status: DC | PRN

## 2017-02-01 ENCOUNTER — Other Ambulatory Visit: Payer: Self-pay

## 2017-02-01 ENCOUNTER — Encounter: Payer: Self-pay | Admitting: Emergency Medicine

## 2017-02-01 DIAGNOSIS — R109 Unspecified abdominal pain: Secondary | ICD-10-CM | POA: Insufficient documentation

## 2017-02-01 DIAGNOSIS — Z5321 Procedure and treatment not carried out due to patient leaving prior to being seen by health care provider: Secondary | ICD-10-CM | POA: Insufficient documentation

## 2017-02-01 LAB — COMPREHENSIVE METABOLIC PANEL
ALT: 9 U/L — AB (ref 14–54)
AST: 18 U/L (ref 15–41)
Albumin: 3.9 g/dL (ref 3.5–5.0)
Alkaline Phosphatase: 37 U/L — ABNORMAL LOW (ref 38–126)
Anion gap: 7 (ref 5–15)
BUN: 7 mg/dL (ref 6–20)
CO2: 24 mmol/L (ref 22–32)
Calcium: 8.8 mg/dL — ABNORMAL LOW (ref 8.9–10.3)
Chloride: 105 mmol/L (ref 101–111)
Creatinine, Ser: 0.61 mg/dL (ref 0.44–1.00)
GFR calc Af Amer: >60 mL/min (ref >60)
GLUCOSE: 112 mg/dL — AB (ref 65–99)
POTASSIUM: 3.5 mmol/L (ref 3.5–5.1)
Sodium: 136 mmol/L (ref 135–145)
TOTAL PROTEIN: 7.1 g/dL (ref 6.5–8.1)
Total Bilirubin: 0.4 mg/dL (ref 0.3–1.2)

## 2017-02-01 LAB — URINALYSIS, COMPLETE (UACMP) WITH MICROSCOPIC
BACTERIA UA: NONE SEEN
Bilirubin Urine: NEGATIVE
Glucose, UA: NEGATIVE mg/dL
Hgb urine dipstick: NEGATIVE
KETONES UR: NEGATIVE mg/dL
Nitrite: NEGATIVE
PROTEIN: NEGATIVE mg/dL
Specific Gravity, Urine: 1.018 (ref 1.005–1.030)
pH: 7 (ref 5.0–8.0)

## 2017-02-01 LAB — TROPONIN I: Troponin I: <0.03 ng/mL (ref <0.03)

## 2017-02-01 LAB — POCT PREGNANCY, URINE: PREG TEST UR: POSITIVE — AB

## 2017-02-01 LAB — HCG, QUANTITATIVE, PREGNANCY: hCG, Beta Chain, Quant, S: 76351 m[IU]/mL — ABNORMAL HIGH (ref <5)

## 2017-02-01 LAB — CBC
HEMATOCRIT: 37.5 % (ref 35.0–47.0)
HEMOGLOBIN: 12.7 g/dL (ref 12.0–16.0)
MCH: 29.1 pg (ref 26.0–34.0)
MCHC: 33.9 g/dL (ref 32.0–36.0)
MCV: 86.1 fL (ref 80.0–100.0)
Platelets: 220 10*3/uL (ref 150–440)
RBC: 4.36 MIL/uL (ref 3.80–5.20)
RDW: 12.9 % (ref 11.5–14.5)
WBC: 9.3 10*3/uL (ref 3.6–11.0)

## 2017-02-01 LAB — LIPASE, BLOOD: LIPASE: 29 U/L (ref 11–51)

## 2017-02-01 NOTE — ED Triage Notes (Signed)
Patient ambulatory to triage with steady gait, without difficulty or distress noted; pt reports upper abd pain with no accomp symptoms; denies hx of same; pt approx 6-[redacted]wks pregnant; has appt at [facility]; G2 P1

## 2017-02-02 ENCOUNTER — Other Ambulatory Visit: Payer: Self-pay

## 2017-02-02 ENCOUNTER — Emergency Department: Payer: Managed Care, Other (non HMO)

## 2017-02-02 ENCOUNTER — Emergency Department
Admission: EM | Admit: 2017-02-02 | Discharge: 2017-02-02 | Disposition: A | Discharge status: Home / Self Care | Payer: Managed Care, Other (non HMO) | Attending: Emergency Medicine | Admitting: Emergency Medicine

## 2017-02-02 ENCOUNTER — Telehealth: Payer: Self-pay | Admitting: Emergency Medicine

## 2017-02-02 DIAGNOSIS — R109 Unspecified abdominal pain: Secondary | ICD-10-CM | POA: Diagnosis not present

## 2017-02-02 DIAGNOSIS — Z87891 Personal history of nicotine dependence: Secondary | ICD-10-CM | POA: Diagnosis not present

## 2017-02-02 DIAGNOSIS — O26891 Other specified pregnancy related conditions, first trimester: Secondary | ICD-10-CM | POA: Insufficient documentation

## 2017-02-02 DIAGNOSIS — Z3A01 Less than 8 weeks gestation of pregnancy: Secondary | ICD-10-CM | POA: Insufficient documentation

## 2017-02-02 LAB — COMPREHENSIVE METABOLIC PANEL
ALBUMIN: 3.8 g/dL (ref 3.5–5.0)
ALT: 7 U/L — AB (ref 14–54)
AST: 14 U/L — AB (ref 15–41)
Alkaline Phosphatase: 34 U/L — ABNORMAL LOW (ref 38–126)
Anion gap: 8 (ref 5–15)
BUN: 5 mg/dL — AB (ref 6–20)
CHLORIDE: 105 mmol/L (ref 101–111)
CO2: 21 mmol/L — AB (ref 22–32)
CREATININE: 0.6 mg/dL (ref 0.44–1.00)
Calcium: 8.6 mg/dL — ABNORMAL LOW (ref 8.9–10.3)
GFR calc Af Amer: >60 mL/min (ref >60)
GFR calc non Af Amer: >60 mL/min (ref >60)
Glucose, Bld: 88 mg/dL (ref 65–99)
POTASSIUM: 3.9 mmol/L (ref 3.5–5.1)
SODIUM: 134 mmol/L — AB (ref 135–145)
Total Bilirubin: 0.7 mg/dL (ref 0.3–1.2)
Total Protein: 6.9 g/dL (ref 6.5–8.1)

## 2017-02-02 LAB — CBC
HEMATOCRIT: 39.4 % (ref 35.0–47.0)
Hemoglobin: 13.2 g/dL (ref 12.0–16.0)
MCH: 28.8 pg (ref 26.0–34.0)
MCHC: 33.4 g/dL (ref 32.0–36.0)
MCV: 86.3 fL (ref 80.0–100.0)
PLATELETS: 216 10*3/uL (ref 150–440)
RBC: 4.57 MIL/uL (ref 3.80–5.20)
RDW: 13.1 % (ref 11.5–14.5)
WBC: 8.2 10*3/uL (ref 3.6–11.0)

## 2017-02-02 LAB — URINALYSIS, COMPLETE (UACMP) WITH MICROSCOPIC
BACTERIA UA: NONE SEEN
BILIRUBIN URINE: NEGATIVE
Glucose, UA: NEGATIVE mg/dL
Hgb urine dipstick: NEGATIVE
KETONES UR: 20 mg/dL — AB
LEUKOCYTES UA: NEGATIVE
Nitrite: NEGATIVE
PH: 6 (ref 5.0–8.0)
Protein, ur: NEGATIVE mg/dL
SPECIFIC GRAVITY, URINE: 1.02 (ref 1.005–1.030)

## 2017-02-02 LAB — LIPASE, BLOOD: LIPASE: 22 U/L (ref 11–51)

## 2017-02-02 MED ORDER — RANITIDINE HCL 150 MG PO TABS: 150.0000 mg | ORAL_TABLET | Freq: Two times a day (BID) | ORAL | 0 refills | Status: DC

## 2017-02-02 NOTE — ED Notes (Signed)
No answer when called for a room to see MD.

## 2017-02-02 NOTE — ED Notes (Signed)
First Nurse Note:  Patient states she was here last night and left before being seen.  States she is 6-[redacted] weeks pregnant.

## 2017-02-02 NOTE — ED Provider Notes (Signed)
[facility] Emergency Department Provider Note   ____________________________________________    I have reviewed the triage vital signs and the nursing notes.   HISTORY  Chief Complaint Abdominal Pain     HPI Angela Mcgrath is a 21 y.o. female who presents with complaints of abdominal pain.  Patient reports she is approximately 5-[redacted] weeks pregnant.  Last menstrual period was early November, she is a G2P1.  Denies vaginal bleeding.  No dysuria.  Complains of intermittent mild diffuse abdominal cramping.  No nausea or vomiting.  She has not taken anything for this.  The pain does not radiate.  Has not seen an OB yet.   Past Medical History:  Diagnosis Date  . Anxiety   . Depression   . Family history of Downs syndrome    FOB-brother deceased  . IBS (irritable bowel syndrome)   . Increased BMI     Patient Active Problem List   Diagnosis Date Noted  . Encounter for initial prescription of contraceptive pills 07/16/2015  . Dysmenorrhea 07/16/2015  . Missed period 07/16/2015  . Vaginitis 10/03/2014    Past Surgical History:  Procedure Laterality Date  . NO PAST SURGERIES    . none      Prior to Admission medications   Medication Sig Start Date End Date Taking? Authorizing Provider  cephALEXin (KEFLEX) 500 MG capsule Take 1 capsule (500 mg total) by mouth 2 (two) times daily. 04/21/16   Jene EveryKinner, Deetya Drouillard, MD  fluconazole (DIFLUCAN) 150 MG tablet Take 1 tablet (150 mg total) by mouth once a week. For two doses 04/27/16   Linzie CollinEvans, David James, MD  ibuprofen (ADVIL,MOTRIN) 800 MG tablet Take 1 tablet (800 mg total) by mouth every 8 (eight) hours as needed. 01/18/17   Defrancesco, Prentice DockerMartin A, MD     Allergies Patient has no known allergies.  Family History  Problem Relation Age of Onset  . Heart attack Mother        3840's  . Diabetes Mother   . COPD Father   . Asthma Father   . Diabetes Maternal Grandmother   . Heart disease Maternal Grandmother    . Diabetes Maternal Grandfather   . Colon cancer Neg Hx   . Ovarian cancer Neg Hx   . Breast cancer Neg Hx     Social History Social History   Tobacco Use  . Smoking status: Former Games developermoker  . Smokeless tobacco: Never Used  Substance Use Topics  . Alcohol use: No    Alcohol/week: 0.0 oz  . Drug use: No    Review of Systems  Constitutional: No fevers Eyes: No visual changes.  ENT: No sore throat. Cardiovascular: Denies chest pain. Respiratory: Denies shortness of breath. Gastrointestinal: As above Genitourinary: Negative for dysuria.  No vaginal bleeding Musculoskeletal: Negative for back pain. Skin: Negative for rash. Neurological: Negative for headaches    ____________________________________________   PHYSICAL EXAM:  VITAL SIGNS: ED Triage Vitals  Enc Vitals Group     BP 02/02/17 1250 131/78     Pulse Rate 02/02/17 1250 80     Resp 02/02/17 1250 15     Temp 02/02/17 1250 98.2 F (36.8 C)     Temp Source 02/02/17 1250 Oral     SpO2 02/02/17 1250 100 %     Weight 02/02/17 1251 79.4 kg (175 lb)     Height 02/02/17 1251 1.524 m (5')     Head Circumference --      Peak Flow --  Pain Score 02/02/17 1250 6     Pain Loc --      Pain Edu? --      Excl. in GC? --     Constitutional: Alert and oriented. No acute distress. Pleasant and interactive Eyes: Conjunctivae are normal.   Nose: No congestion/rhinnorhea. Mouth/Throat: Mucous membranes are moist.    Cardiovascular: Normal rate, regular rhythm. Peri [place].  Good peripheral circulation. Respiratory: Normal respiratory effort.  No retractions. Gastrointestinal: Soft and nontender. No distention.  No CVA tenderness. Genitourinary: deferred Musculoskeletal:   Warm and well perfused Neurologic:  Normal speech and language. No gross focal neurologic deficits are appreciated.  Skin:  Skin is warm, dry and intact. No rash noted. Psychiatric: Mood and affect are normal. Speech and behavior are  normal.  ____________________________________________   LABS (all labs ordered are listed, but only abnormal results are displayed)  Labs Reviewed  COMPREHENSIVE METABOLIC PANEL - Abnormal; Notable for the following components:      Result Value   Sodium 134 (*)    CO2 21 (*)    BUN 5 (*)    Calcium 8.6 (*)    AST 14 (*)    ALT 7 (*)    Alkaline Phosphatase 34 (*)    All other components within normal limits  URINALYSIS, COMPLETE (UACMP) WITH MICROSCOPIC - Abnormal; Notable for the following components:   Color, Urine YELLOW (*)    APPearance HAZY (*)    Ketones, ur 20 (*)    Squamous Epithelial / LPF 6-30 (*)    All other components within normal limits  LIPASE, BLOOD  CBC  POC URINE PREG, ED   ____________________________________________  EKG  None ____________________________________________  RADIOLOGY  Ultrasound pending ____________________________________________   PROCEDURES  Procedure(s) performed: No  Procedures   Critical Care performed: No ____________________________________________   INITIAL IMPRESSION / ASSESSMENT AND PLAN / ED COURSE  Pertinent labs & imaging results that were available during my care of the patient were reviewed by me and considered in my medical decision making (see chart for details).  She well-appearing in no acute distress, very mild intermittent abdominal discomfort.  HCG performed last night before she left up being seen over 70,000, will obtain ultrasound to rule out ectopic  ----------------------------------------- 4:36 PM on 02/02/2017 -----------------------------------------  Ultrasound demonstrates 7-week 5-day IUP, possible small subchorionic hemorrhage, discussed with patient the need for follow-up  Will rx ranitidine for likely gastritis, has follow up with OB    ____________________________________________   FINAL CLINICAL IMPRESSION(S) / ED DIAGNOSES  Final diagnoses:  Abdominal pain during  pregnancy in first trimester        Note:  This document was prepared using Dragon voice recognition software and may include unintentional dictation errors.    Jene EveryKinner, Jazzalynn Rhudy, MD 02/02/17 1640

## 2017-02-02 NOTE — Telephone Encounter (Signed)
Called patient due to lwot to inquire about condition and follow up plans. She says she continues to have pain.  I advised that she really needs an exam.  I told her to call pcp and explain the pain and that she had labs done here.  I told her they could advise her to  Return or possibly see her in office.

## 2017-02-02 NOTE — ED Notes (Signed)
See provider and triage note

## 2017-02-02 NOTE — ED Triage Notes (Signed)
Pt is c/o abd cramping and pain x3 days - pt is 5-[redacted] weeks pregnant - denies vaginal bleeding

## 2017-02-03 LAB — POCT PREGNANCY, URINE: PREG TEST UR: POSITIVE — AB

## 2017-02-08 NOTE — L&D Delivery Note (Signed)
Delivery Note:  0715 In room to see patient, reports pelvic pressure and the urge push. SVE: 10/100/+3, vertex. Effective maternal pushing efforts noted. FHR decelerations noted after pushing, slow to resolve.   [id number] Dr. Valentino Saxonherry notified of possible need for low vacuum assisted birth due to FHR decelerations.   Spontaneous vaginal birth of liveborn female patient in left occiput anterior position over intact perineum at 0726. Tight nuchal cord x 2, unable to reduce, double clamped and cut on perineum. Infant immediately to maternal abdomen. Three (3) vessel cord, cord blood collected. APGARs: 8, 9. Weight: 2900 g (6 pounds 6 ounces). Receiving nurse present at bedside for birth.   Pitocin infusing. Spontaneous delivery of intact placenta at 0728. Bilateral labial lacerations repaired with 3-0 vicryl rapide under local and epidural anesthesia. Lacerations well approximated and hemostatic. In and out catheter performed with sterile technique. Urine: 100 ml. QBL: pending. Vault check completed. Counts correct x 2.   Initiate routine postpartum care and orders. Mom to postpartum.  Baby to Couplet care / Skin to Skin.  FOB and family members present at bedside and overjoyed with the birth of "Angela Mcgrath".    Gunnar BullaJenkins Michelle Lawhorn, CNM [facility], [facility] 09/21/2017, 10:03 AM

## 2017-02-11 ENCOUNTER — Ambulatory Visit: Payer: Managed Care, Other (non HMO) | Admitting: Certified Nurse Midwife

## 2017-02-11 ENCOUNTER — Other Ambulatory Visit: Payer: Self-pay

## 2017-02-11 VITALS — BP 111/66 | HR 93 | Ht 60.0 in | Wt 177.2 lb

## 2017-02-11 DIAGNOSIS — N926 Irregular menstruation, unspecified: Secondary | ICD-10-CM

## 2017-02-11 DIAGNOSIS — Z3481 Encounter for supervision of other normal pregnancy, first trimester: Secondary | ICD-10-CM

## 2017-02-11 NOTE — Progress Notes (Signed)
Angela Mcgrath presents for NOB nurse interview visit. Pregnancy confirmation done _____12/26/18_.  G- .3  P- 1 011  . Pregnancy education material explained and given. _1__ cats in the home. NOB labs ordered. (TSH/HbgA1c due to Increased BMI), (sickle cell). HIV labs and Drug screen were explained optional and she did not decline. Drug screen ordered. PNV encouraged. Genetic screening options discussed. Genetic testing: Ordered/Declined/Unsure.  Pt may discuss with provider. Pt. To follow up with provider in _3_ weeks for NOB physical.  All questions answered.

## 2017-02-12 LAB — MICROSCOPIC EXAMINATION: CASTS: NONE SEEN /LPF

## 2017-02-12 LAB — CBC WITH DIFFERENTIAL
BASOS ABS: 0 10*3/uL (ref 0.0–0.2)
Basos: 0 %
EOS (ABSOLUTE): 0.1 10*3/uL (ref 0.0–0.4)
Eos: 1 %
HEMOGLOBIN: 13.3 g/dL (ref 11.1–15.9)
Hematocrit: 40.6 % (ref 34.0–46.6)
IMMATURE GRANULOCYTES: 0 %
Immature Grans (Abs): 0 10*3/uL (ref 0.0–0.1)
LYMPHS: 28 %
Lymphocytes Absolute: 2.1 10*3/uL (ref 0.7–3.1)
MCH: 28.5 pg (ref 26.6–33.0)
MCHC: 32.8 g/dL (ref 31.5–35.7)
MCV: 87 fL (ref 79–97)
Monocytes Absolute: 0.7 10*3/uL (ref 0.1–0.9)
Monocytes: 9 %
NEUTROS ABS: 4.8 10*3/uL (ref 1.4–7.0)
Neutrophils: 62 %
RBC: 4.66 x10E6/uL (ref 3.77–5.28)
RDW: 13 % (ref 12.3–15.4)
WBC: 7.7 10*3/uL (ref 3.4–10.8)

## 2017-02-12 LAB — ABO AND RH: RH TYPE: POSITIVE

## 2017-02-12 LAB — VARICELLA ZOSTER ANTIBODY, IGG: Varicella zoster IgG: 995 index (ref >165)

## 2017-02-12 LAB — URINALYSIS, ROUTINE W REFLEX MICROSCOPIC
Bilirubin, UA: NEGATIVE
Glucose, UA: NEGATIVE
Ketones, UA: NEGATIVE
NITRITE UA: NEGATIVE
PH UA: 7.5 (ref 5.0–7.5)
Protein, UA: NEGATIVE
RBC, UA: NEGATIVE
Specific Gravity, UA: 1.013 (ref 1.005–1.030)
Urobilinogen, Ur: 0.2 mg/dL (ref 0.2–1.0)

## 2017-02-12 LAB — ANTIBODY SCREEN: Antibody Screen: NEGATIVE

## 2017-02-12 LAB — RPR: RPR: NONREACTIVE

## 2017-02-12 LAB — TOXOPLASMA ANTIBODIES- IGG AND  IGM: Toxoplasma Antibody- IgM: <3 AU/mL (ref 0.0–7.9)

## 2017-02-12 LAB — RUBELLA SCREEN: RUBELLA: 1.82 {index} (ref >0.99)

## 2017-02-12 LAB — HEPATITIS B SURFACE ANTIGEN: HEP B S AG: NEGATIVE

## 2017-02-12 LAB — HIV ANTIBODY (ROUTINE TESTING W REFLEX): HIV SCREEN 4TH GENERATION: NONREACTIVE

## 2017-02-13 LAB — URINE CULTURE: ORGANISM ID, BACTERIA: NO GROWTH

## 2017-02-14 LAB — MONITOR DRUG PROFILE 14(MW)
Amphetamine Scrn, Ur: NEGATIVE ng/mL
BARBITURATE SCREEN URINE: NEGATIVE ng/mL
BENZODIAZEPINE SCREEN, URINE: NEGATIVE ng/mL
Buprenorphine, Urine: NEGATIVE ng/mL
CANNABINOIDS UR QL SCN: NEGATIVE ng/mL
CREATININE(CRT), U: 115.9 mg/dL (ref 20.0–300.0)
Cocaine (Metab) Scrn, Ur: NEGATIVE ng/mL
FENTANYL, URINE: NEGATIVE pg/mL
MEPERIDINE SCREEN, URINE: NEGATIVE ng/mL
Methadone Screen, Urine: NEGATIVE ng/mL
OXYCODONE+OXYMORPHONE UR QL SCN: NEGATIVE ng/mL
Opiate Scrn, Ur: NEGATIVE ng/mL
PHENCYCLIDINE QUANTITATIVE URINE: NEGATIVE ng/mL
Ph of Urine: 7.1 (ref 4.5–8.9)
Propoxyphene Scrn, Ur: NEGATIVE ng/mL
SPECIFIC GRAVITY: 1.013
TRAMADOL SCREEN, URINE: NEGATIVE ng/mL

## 2017-02-14 LAB — NICOTINE SCREEN, URINE: Cotinine Ql Scrn, Ur: NEGATIVE ng/mL

## 2017-02-18 ENCOUNTER — Encounter: Payer: Self-pay | Admitting: Certified Nurse Midwife

## 2017-03-02 ENCOUNTER — Ambulatory Visit (INDEPENDENT_AMBULATORY_CARE_PROVIDER_SITE_OTHER): Payer: Managed Care, Other (non HMO) | Admitting: Certified Nurse Midwife

## 2017-03-02 ENCOUNTER — Encounter: Payer: Self-pay | Admitting: Certified Nurse Midwife

## 2017-03-02 VITALS — BP 109/63 | HR 84 | Wt 174.0 lb

## 2017-03-02 DIAGNOSIS — Z3481 Encounter for supervision of other normal pregnancy, first trimester: Secondary | ICD-10-CM

## 2017-03-02 LAB — POCT URINALYSIS DIPSTICK
BILIRUBIN UA: NEGATIVE
Glucose, UA: NEGATIVE
KETONES UA: NEGATIVE
Leukocytes, UA: NEGATIVE
Nitrite, UA: NEGATIVE
PH UA: 5 (ref 5.0–8.0)
Protein, UA: NEGATIVE
RBC UA: NEGATIVE
Spec Grav, UA: 1.025 (ref 1.010–1.025)
UROBILINOGEN UA: 0.2 U/dL

## 2017-03-02 NOTE — Progress Notes (Signed)
Pt is here for a new OB physical. States she was dx this AM with URI and was advised to take benadryl. LMP 12/10/16. N/V present.

## 2017-03-02 NOTE — Progress Notes (Signed)
NEW OB HISTORY AND PHYSICAL  SUBJECTIVE:       Angela Mcgrath is a 22 y.o. [id number] female, Patient's last menstrual period was 12/10/2016 (exact date)., Estimated Date of Delivery: 09/16/17, [id number], presents today for establishment of Prenatal Care. She has no unusual complaints.    Gynecologic History Patient's last menstrual period was 12/10/2016 (exact date). Normal Contraception: none Last Pap: 07/17/2015. Results were: normal  Obstetric History OB History  Gravida Para Term Preterm AB Living  3 1 1   1 1   SAB TAB Ectopic Multiple Live Births  1       1    # Outcome Date GA Lbr Len/2nd Weight Sex Delivery Anes PTL Lv  3 Current           2 Term 07/15/12    F Vag-Spont   LIV  1 SAB 07/2011        FD      Past Medical History:  Diagnosis Date  . Anxiety   . Depression   . Family history of Downs syndrome    FOB-brother deceased  . IBS (irritable bowel syndrome)   . Increased BMI     Past Surgical History:  Procedure Laterality Date  . NO PAST SURGERIES    . none      Current Outpatient Medications on File Prior to Visit  Medication Sig Dispense Refill  . Prenatal Vit-Fe Fumarate-FA (PRENATAL MULTIVITAMIN) TABS tablet Take 1 tablet by mouth daily at 12 noon.     No current facility-administered medications on file prior to visit.     No Known Allergies  Social History   Socioeconomic History  . Marital status: Single    Spouse name: Not on file  . Number of children: Not on file  . Years of education: Not on file  . Highest education level: Not on file  Social Needs  . Financial resource strain: Not on file  . Food insecurity - worry: Not on file  . Food insecurity - inability: Not on file  . Transportation needs - medical: Not on file  . Transportation needs - non-medical: Not on file  Occupational History  . Not on file  Tobacco Use  . Smoking status: Former Games developermoker  . Smokeless tobacco: Never Used  Substance and Sexual Activity  . Alcohol use: No     Alcohol/week: 0.0 oz  . Drug use: No  . Sexual activity: Yes    Partners: Male    Birth control/protection: None  Other Topics Concern  . Not on file  Social History Narrative  . Not on file    Family History  Problem Relation Age of Onset  . Heart attack Mother        5140's  . Diabetes Mother   . COPD Father   . Asthma Father   . Diabetes Maternal Grandmother   . Heart disease Maternal Grandmother   . Diabetes Maternal Grandfather   . Colon cancer Neg Hx   . Ovarian cancer Neg Hx   . Breast cancer Neg Hx     The following portions of the patient's history were reviewed and updated as appropriate: allergies, current medications, past OB history, past medical history, past surgical history, past family history, past social history, and problem list.    OBJECTIVE: Initial Physical Exam (New OB)  GENERAL APPEARANCE: alert, well appearing, in no apparent distress, oriented to person, place and time, overweight HEAD: normocephalic, atraumatic MOUTH: mucous membranes moist, pharynx normal without lesions THYROID:  no thyromegaly or masses present BREASTS: no masses noted, no significant tenderness, no palpable axillary nodes, no skin changes LUNGS: clear to auscultation, no wheezes, rales or rhonchi, symmetric air entry HEART: regular rate and rhythm, no murmurs ABDOMEN: soft, nontender, nondistended, no abnormal masses, no epigastric pain EXTREMITIES: no redness or tenderness in the calves or thighs SKIN: normal coloration and turgor, no rashes LYMPH NODES: no adenopathy palpable NEUROLOGIC: alert, oriented, normal speech, no focal findings or movement disorder noted  PELVIC EXAM EXTERNAL GENITALIA: normal appearing vulva with no masses, tenderness or lesions VAGINA: no abnormal discharge or lesions CERVIX: no lesions or cervical motion tenderness UTERUS: gravid ADNEXA: no masses palpable and nontender OB EXAM PELVIMETRY: appears adequate RECTUM: exam not  indicated  ASSESSMENT: Normal pregnancy  PLAN: New OB counseling: The patient has been given an overview regarding routine prenatal care. Recommendations regarding diet, weight gain, and exercise in pregnancy were given. Prenatal testing, optional genetic testing, and ultrasound use in pregnancy were reviewed. She is undecided on genetic testing at this time. Benefits of Breast Feeding were discussed. The patient is encouraged to consider nursing her baby post partum. Follow up 4 wks.   Doreene Burke, CNM

## 2017-03-02 NOTE — Patient Instructions (Signed)

## 2017-03-08 ENCOUNTER — Ambulatory Visit (INDEPENDENT_AMBULATORY_CARE_PROVIDER_SITE_OTHER): Payer: Managed Care, Other (non HMO) | Admitting: Certified Nurse Midwife

## 2017-03-08 ENCOUNTER — Encounter: Payer: Managed Care, Other (non HMO) | Admitting: Certified Nurse Midwife

## 2017-03-08 VITALS — BP 116/75 | HR 93 | Wt 175.2 lb

## 2017-03-08 DIAGNOSIS — Z3481 Encounter for supervision of other normal pregnancy, first trimester: Secondary | ICD-10-CM

## 2017-03-08 LAB — POCT URINALYSIS DIPSTICK
Bilirubin, UA: NEGATIVE
Blood, UA: NEGATIVE
Glucose, UA: NEGATIVE
Ketones, UA: NEGATIVE
Leukocytes, UA: NEGATIVE
NITRITE UA: NEGATIVE
PH UA: 8 (ref 5.0–8.0)
PROTEIN UA: NEGATIVE
Spec Grav, UA: 1.01 (ref 1.010–1.025)
UROBILINOGEN UA: 0.2 U/dL

## 2017-03-08 NOTE — Progress Notes (Signed)
PT presents for follow up from ED . She went to ed for back pain. Discussed normal musculoskeletal discomforts of pregnancy, encouraged use of tylenol, belly band, heat, and cold pack. Encouraged back stretches and suggested use of chiropractor if needed. She verbalizes understanding and agrees to plan of care. Follow up as scheduled.   Doreene BurkeAnnie Bhargav Barbaro, CNM

## 2017-03-08 NOTE — Progress Notes (Signed)
[facility] due to back pain. States the tylenol and lidocaine patch did not work. States she needs an off work slip for 27th and 28th.

## 2017-03-08 NOTE — Patient Instructions (Signed)
Back Pain in Pregnancy Back pain during pregnancy is common. Back pain may be caused by several factors that are related to changes during your pregnancy. Follow these instructions at home: Managing pain, stiffness, and swelling  If directed, apply ice for sudden (acute) back pain. ? Put ice in a plastic bag. ? Place a towel between your skin and the bag. ? Leave the ice on for 20 minutes, 2-3 times per day.  If directed, apply heat to the affected area before you exercise: ? Place a towel between your skin and the heat pack or heating pad. ? Leave the heat on for 20-30 minutes. ? Remove the heat if your skin turns bright red. This is especially important if you are unable to feel pain, heat, or cold. You may have a greater risk of getting burned. Activity  Exercise as told by your health care provider. Exercising is the best way to prevent or manage back pain.  Listen to your body when lifting. If lifting hurts, ask for help or bend your knees. This uses your leg muscles instead of your back muscles.  Squat down when picking up something from the floor. Do not bend over.  Only use bed rest as told by your health care provider. Bed rest should only be used for the most severe episodes of back pain. Standing, Sitting, and Lying Down  Do not stand in one place for long periods of time.  Use good posture when sitting. Make sure your head rests over your shoulders and is not hanging forward. Use a pillow on your lower back if necessary.  Try sleeping on your side, preferably the left side, with a pillow or two between your legs. If you are sore after a night's rest, your bed may be too soft. A firm mattress may provide more support for your back during pregnancy. General instructions  Do not wear high heels.  Eat a healthy diet. Try to gain weight within your health care provider's recommendations.  Use a maternity girdle, elastic sling, or back brace as told by your health care  provider.  Take over-the-counter and prescription medicines only as told by your health care provider.  Keep all follow-up visits as told by your health care provider. This is important. This includes any visits with any specialists, such as a physical therapist. Contact a health care provider if:  Your back pain interferes with your daily activities.  You have increasing pain in other parts of your body. Get help right away if:  You develop numbness, tingling, weakness, or problems with the use of your arms or legs.  You develop severe back pain that is not controlled with medicine.  You have a sudden change in bowel or bladder control.  You develop shortness of breath, dizziness, or you faint.  You develop nausea, vomiting, or sweating.  You have back pain that is a rhythmic, cramping pain similar to labor pains. Labor pain is usually 1-2 minutes apart, lasts for about 1 minute, and involves a bearing down feeling or pressure in your pelvis.  You have back pain and your water breaks or you have vaginal bleeding.  You have back pain or numbness that travels down your leg.  Your back pain developed after you fell.  You develop pain on one side of your back.  You see blood in your urine.  You develop skin blisters in the area of your back pain. This information is not intended to replace advice given to you   by your health care provider. Make sure you discuss any questions you have with your health care provider. Document Released: 05/05/2005 Document Revised: 07/03/2015 Document Reviewed: 10/09/2014 Elsevier Interactive Patient Education  2018 Elsevier Inc.  

## 2017-03-10 LAB — CULTURE, OB URINE

## 2017-03-10 LAB — URINE CULTURE, OB REFLEX

## 2017-03-11 ENCOUNTER — Encounter: Payer: Self-pay | Admitting: Certified Nurse Midwife

## 2017-03-11 ENCOUNTER — Other Ambulatory Visit: Payer: Self-pay | Admitting: Certified Nurse Midwife

## 2017-03-11 MED ORDER — NITROFURANTOIN MONOHYD MACRO 100 MG PO CAPS: 100.0000 mg | ORAL_CAPSULE | Freq: Two times a day (BID) | ORAL | 0 refills | Status: AC

## 2017-03-11 NOTE — Progress Notes (Signed)
Urine culture shows 50,000-100, 000 bacteria and pt is symptomatic. Order placed for Macrobid. PT notified via my chart.   Doreene BurkeAnnie Sparkles Mcneely, CNM

## 2017-03-23 ENCOUNTER — Ambulatory Visit (INDEPENDENT_AMBULATORY_CARE_PROVIDER_SITE_OTHER): Payer: Managed Care, Other (non HMO) | Admitting: Certified Nurse Midwife

## 2017-03-23 VITALS — BP 105/72 | HR 93 | Temp 98.2°F | Wt 175.1 lb

## 2017-03-23 DIAGNOSIS — Z3481 Encounter for supervision of other normal pregnancy, first trimester: Secondary | ICD-10-CM | POA: Diagnosis not present

## 2017-03-23 LAB — POCT URINALYSIS DIPSTICK
BILIRUBIN UA: NEGATIVE
GLUCOSE UA: NEGATIVE
Ketones, UA: NEGATIVE
Leukocytes, UA: NEGATIVE
Nitrite, UA: NEGATIVE
Protein, UA: NEGATIVE
RBC UA: NEGATIVE
SPEC GRAV UA: 1.01 (ref 1.010–1.025)
Urobilinogen, UA: 0.2 E.U./dL
pH, UA: 7 (ref 5.0–8.0)

## 2017-03-23 NOTE — Patient Instructions (Signed)

## 2017-03-23 NOTE — Progress Notes (Signed)
ROB doing well. She has a cold. Reviewed medications list. She verbalize understanding and agrees. Follow up 4 wks.   Angela BossAnnie Orestes Mcgrath< CNM

## 2017-03-23 NOTE — Progress Notes (Signed)
Pt is here with c/o fever 100.6, stuffy and runny nose, cough. States no OTC are working.

## 2017-03-31 ENCOUNTER — Ambulatory Visit (INDEPENDENT_AMBULATORY_CARE_PROVIDER_SITE_OTHER): Payer: Managed Care, Other (non HMO) | Admitting: Certified Nurse Midwife

## 2017-03-31 ENCOUNTER — Encounter: Payer: Self-pay | Admitting: Certified Nurse Midwife

## 2017-03-31 VITALS — BP 93/60 | HR 81 | Wt 173.5 lb

## 2017-03-31 DIAGNOSIS — Z3482 Encounter for supervision of other normal pregnancy, second trimester: Secondary | ICD-10-CM | POA: Diagnosis not present

## 2017-03-31 LAB — POCT URINALYSIS DIPSTICK
BILIRUBIN UA: NEGATIVE
Glucose, UA: NEGATIVE
KETONES UA: NEGATIVE
Leukocytes, UA: NEGATIVE
NITRITE UA: NEGATIVE
Odor: NEGATIVE
PROTEIN UA: NEGATIVE
RBC UA: NEGATIVE
Spec Grav, UA: 1.015 (ref 1.010–1.025)
UROBILINOGEN UA: 0.2 U/dL
pH, UA: 7.5 (ref 5.0–8.0)

## 2017-03-31 NOTE — Patient Instructions (Signed)
Common Medications Safe in Pregnancy  Acne:      Constipation:  Benzoyl Peroxide     Colace  Clindamycin      Dulcolax Suppository  Topica Erythromycin     Fibercon  Salicylic Acid      Metamucil         Miralax AVOID:        Senakot   Accutane    Cough:  Retin-A       Cough Drops  Tetracycline      Phenergan w/ Codeine if Rx  Minocycline      Robitussin (Plain & DM)  Antibiotics:     Crabs/Lice:  Ceclor       RID  Cephalosporins    AVOID:  E-Mycins      Kwell  Keflex  Macrobid/Macrodantin   Diarrhea:  Penicillin      Kao-Pectate  Zithromax      Imodium AD         PUSH FLUIDS AVOID:       Cipro     Fever:  Tetracycline      Tylenol (Regular or Extra  Minocycline       Strength)  Levaquin      Extra Strength-Do not          Exceed 8 tabs/24 hrs Caffeine:        <200mg/day (equiv. To 1 cup of coffee or  approx. 3 12 oz sodas)         Gas: Cold/Hayfever:       Gas-X  Benadryl      Mylicon  Claritin       Phazyme  **Claritin-D        Chlor-Trimeton    Headaches:  Dimetapp      ASA-Free Excedrin  Drixoral-Non-Drowsy     Cold Compress  Mucinex (Guaifenasin)     Tylenol (Regular or Extra  Sudafed/Sudafed-12 Hour     Strength)  **Sudafed PE Pseudoephedrine   Tylenol Cold & Sinus     Vicks Vapor Rub  Zyrtec  **AVOID if Problems With Blood Pressure         Heartburn: Avoid lying down for at least 1 hour after meals  Aciphex      Maalox     Rash:  Milk of Magnesia     Benadryl    Mylanta       1% Hydrocortisone Cream  Pepcid  Pepcid Complete   Sleep Aids:  Prevacid      Ambien   Prilosec       Benadryl  Rolaids       Chamomile Tea  Tums (Limit 4/day)     Unisom  Zantac       Tylenol PM         Warm milk-add vanilla or  Hemorrhoids:       Sugar for taste  Anusol/Anusol H.C.  (RX: Analapram 2.5%)  Sugar Substitutes:  Hydrocortisone OTC     Ok in moderation  Preparation H      Tucks        Vaseline lotion applied to tissue with  wiping    Herpes:     Throat:  Acyclovir      Oragel  Famvir  Valtrex     Vaccines:         Flu Shot Leg Cramps:       *Gardasil  Benadryl      Hepatitis A         Hepatitis B Nasal Spray:         Pneumovax  Saline Nasal Spray     Polio Booster         Tetanus Nausea:       Tuberculosis test or PPD  Vitamin B6 25 mg TID   AVOID:    Dramamine      *Gardasil  Emetrol       Live Poliovirus  Ginger Root 250 mg QID    MMR (measles, mumps &  High Complex Carbs @ Bedtime    rebella)  Sea Bands-Accupressure    Varicella (Chickenpox)  Unisom 1/2 tab TID     *No known complications           If received before Pain:         Known pregnancy;   Darvocet       Resume series after  Lortab        Delivery  Percocet    Yeast:   Tramadol      Femstat  Tylenol 3      Gyne-lotrimin  Ultram       Monistat  Vicodin           MISC:         All Sunscreens           Hair Coloring/highlights          Insect Repellant's          (Including DEET)         Mystic Tans Back Pain in Pregnancy Back pain during pregnancy is common. Back pain may be caused by several factors that are related to changes during your pregnancy. Follow these instructions at home: Managing pain, stiffness, and swelling  If directed, apply ice for sudden (acute) back pain. ? Put ice in a plastic bag. ? Place a towel between your skin and the bag. ? Leave the ice on for 20 minutes, 2-3 times per day.  If directed, apply heat to the affected area before you exercise: ? Place a towel between your skin and the heat pack or heating pad. ? Leave the heat on for 20-30 minutes. ? Remove the heat if your skin turns bright red. This is especially important if you are unable to feel pain, heat, or cold. You may have a greater risk of getting burned. Activity  Exercise as told by your health care provider. Exercising is the best way to prevent or manage back pain.  Listen to your body when lifting. If lifting hurts, ask for help or  bend your knees. This uses your leg muscles instead of your back muscles.  Squat down when picking up something from the floor. Do not bend over.  Only use bed rest as told by your health care provider. Bed rest should only be used for the most severe episodes of back pain. Standing, Sitting, and Lying Down  Do not stand in one place for long periods of time.  Use good posture when sitting. Make sure your head rests over your shoulders and is not hanging forward. Use a pillow on your lower back if necessary.  Try sleeping on your side, preferably the left side, with a pillow or two between your legs. If you are sore after a night's rest, your bed may be too soft. A firm mattress may provide more support for your back during pregnancy. General instructions  Do not wear high heels.  Eat a healthy diet. Try to gain weight within your health care provider's recommendations.  Use a maternity girdle, elastic sling, or   back brace as told by your health care provider.  Take over-the-counter and prescription medicines only as told by your health care provider.  Keep all follow-up visits as told by your health care provider. This is important. This includes any visits with any specialists, such as a physical therapist. Contact a health care provider if:  Your back pain interferes with your daily activities.  You have increasing pain in other parts of your body. Get help right away if:  You develop numbness, tingling, weakness, or problems with the use of your arms or legs.  You develop severe back pain that is not controlled with medicine.  You have a sudden change in bowel or bladder control.  You develop shortness of breath, dizziness, or you faint.  You develop nausea, vomiting, or sweating.  You have back pain that is a rhythmic, cramping pain similar to labor pains. Labor pain is usually 1-2 minutes apart, lasts for about 1 minute, and involves a bearing down feeling or pressure in  your pelvis.  You have back pain and your water breaks or you have vaginal bleeding.  You have back pain or numbness that travels down your leg.  Your back pain developed after you fell.  You develop pain on one side of your back.  You see blood in your urine.  You develop skin blisters in the area of your back pain. This information is not intended to replace advice given to you by your health care provider. Make sure you discuss any questions you have with your health care provider. Document Released: 05/05/2005 Document Revised: 07/03/2015 Document Reviewed: 10/09/2014 Elsevier Interactive Patient Education  2018 [employer]. Round Ligament Pain The round ligament is a cord of muscle and tissue that helps to support the uterus. It can become a source of pain during pregnancy if it becomes stretched or twisted as the baby grows. The pain usually begins in the second trimester of pregnancy, and it can come and go until the baby is delivered. It is not a serious problem, and it does not cause harm to the baby. Round ligament pain is usually a short, sharp, and pinching pain, but it can also be a dull, lingering, and aching pain. The pain is felt in the lower side of the abdomen or in the groin. It usually starts deep in the groin and moves up to the outside of the hip area. Pain can occur with:  A sudden change in position.  Rolling over in bed.  Coughing or sneezing.  Physical activity.  Follow these instructions at home: Watch your condition for any changes. Take these steps to help with your pain:  When the pain starts, relax. Then try: ? Sitting down. ? Flexing your knees up to your abdomen. ? Lying on your side with one pillow under your abdomen and another pillow between your legs. ? Sitting in a warm bath for 15-20 minutes or until the pain goes away.  Take over-the-counter and prescription medicines only as told by your health care provider.  Move slowly when you sit  and stand.  Avoid long walks if they cause pain.  Stop or lessen your physical activities if they cause pain.  Contact a health care provider if:  Your pain does not go away with treatment.  You feel pain in your back that you did not have before.  Your medicine is not helping. Get help right away if:  You develop a fever or chills.  You develop uterine contractions.  You develop vaginal bleeding.  You develop nausea or vomiting.  You develop diarrhea.  You have pain when you urinate. This information is not intended to replace advice given to you by your health care provider. Make sure you discuss any questions you have with your health care provider. Document Released: 11/04/2007 Document Revised: 07/03/2015 Document Reviewed: 04/03/2014 Elsevier Interactive Patient Education  [employer].

## 2017-03-31 NOTE — Progress Notes (Signed)
ROB- no complaints. Declines flu vaccine.  

## 2017-03-31 NOTE — Progress Notes (Signed)
ROB-Doing well, no questions or concerns. Declines flu shot. Uncertain regarding MD vs midwifery care, would like to meet third midwife. Reviewed red flag symptoms and when to call. RTC x 4 week for anatomy scan and ROB or sooner if needed.

## 2017-04-20 ENCOUNTER — Telehealth: Payer: Self-pay | Admitting: Certified Nurse Midwife

## 2017-04-20 ENCOUNTER — Encounter: Payer: Self-pay | Admitting: Obstetrics and Gynecology

## 2017-04-20 NOTE — Telephone Encounter (Signed)
The patient called and stated that she had a 3-D u/s done and to the patient the images do not look "normal" the patient would like to speak with a nurse to possibly move her u/s to an earlier date or find a solution for her concerns. Please advise.

## 2017-04-21 NOTE — Telephone Encounter (Signed)
Pt aware we can not vouch for other facilities images. We will keep anatomy appt for 3/20 as it is close to [redacted] weeks gestation. Pt voices understanding.

## 2017-04-21 NOTE — Telephone Encounter (Signed)
LMTRC

## 2017-04-22 ENCOUNTER — Encounter: Payer: Self-pay | Admitting: *Deleted

## 2017-04-27 ENCOUNTER — Ambulatory Visit (INDEPENDENT_AMBULATORY_CARE_PROVIDER_SITE_OTHER): Payer: Medicaid Other

## 2017-04-27 ENCOUNTER — Encounter: Payer: Self-pay | Admitting: Obstetrics and Gynecology

## 2017-04-27 ENCOUNTER — Ambulatory Visit (INDEPENDENT_AMBULATORY_CARE_PROVIDER_SITE_OTHER): Payer: Medicaid Other | Admitting: Obstetrics and Gynecology

## 2017-04-27 ENCOUNTER — Encounter: Payer: Medicaid Other | Admitting: Obstetrics and Gynecology

## 2017-04-27 VITALS — BP 125/68 | HR 97 | Wt 178.8 lb

## 2017-04-27 DIAGNOSIS — Z3481 Encounter for supervision of other normal pregnancy, first trimester: Secondary | ICD-10-CM

## 2017-04-27 DIAGNOSIS — Z3492 Encounter for supervision of normal pregnancy, unspecified, second trimester: Secondary | ICD-10-CM | POA: Diagnosis not present

## 2017-04-27 LAB — POCT URINALYSIS DIPSTICK
Bilirubin, UA: NEGATIVE
Glucose, UA: NEGATIVE
Ketones, UA: NEGATIVE
Leukocytes, UA: NEGATIVE
Nitrite, UA: NEGATIVE
PH UA: 6.5 (ref 5.0–8.0)
Protein, UA: NEGATIVE
RBC UA: NEGATIVE
Spec Grav, UA: 1.015 (ref 1.010–1.025)
UROBILINOGEN UA: 0.2 U/dL

## 2017-04-27 NOTE — Progress Notes (Signed)
ROB & anatomy- reviewed anatomy scan:  ations: Anatomy Findings:  Singleton intrauterine pregnancy is visualized with FHR at 130 BPM. Biometrics give an (U/S) Gestational age of 24 2/7 weeks and an (U/S) EDD of 09/19/17; this correlates with the clinically established EDD of 09/16/17.  Fetal presentation is footling breech.  EFW: 286 grams (0lb 10oz). Placenta: Anterior and grade 1. AFI: WNL subjectively.  Anatomic survey is incomplete.  Visualized anatomy appears WNL.  Views of the fetal heart are needed to complete survey.  Gender - Female.   Right Ovary measures 2.9 x 2.3 x 1.6 cm. It is normal in appearance. Left Ovary measures 4.1 x 2.3 x 2.6 cm. It is normal appearance. There is no obvious evidence of a corpus luteal cyst. Survey of the adnexa demonstrates no adnexal masses. There is no free peritoneal fluid in the cul de sac.  Impression: 1. 19 2/7 week Viable Singleton Intrauterine pregnancy by U/S. 2. (U/S) EDD is consistent with Clinically established (LMP) EDD of 09/16/17. 3. Incomplete anatomy scan - Views of the fetal heart are needed to complete exam.

## 2017-04-27 NOTE — Progress Notes (Signed)
ROB- anatomy scan done today, pt is doing well 

## 2017-04-28 ENCOUNTER — Encounter: Payer: Self-pay | Admitting: Obstetrics and Gynecology

## 2017-04-30 ENCOUNTER — Encounter: Payer: Self-pay | Admitting: Obstetrics and Gynecology

## 2017-05-03 ENCOUNTER — Encounter: Payer: Self-pay | Admitting: Obstetrics and Gynecology

## 2017-05-13 ENCOUNTER — Ambulatory Visit (INDEPENDENT_AMBULATORY_CARE_PROVIDER_SITE_OTHER): Payer: Medicaid Other

## 2017-05-13 DIAGNOSIS — Z3492 Encounter for supervision of normal pregnancy, unspecified, second trimester: Secondary | ICD-10-CM

## 2017-05-30 ENCOUNTER — Encounter: Payer: Medicaid Other | Admitting: Certified Nurse Midwife

## 2017-06-02 ENCOUNTER — Ambulatory Visit (INDEPENDENT_AMBULATORY_CARE_PROVIDER_SITE_OTHER): Payer: Medicaid Other | Admitting: Certified Nurse Midwife

## 2017-06-02 VITALS — BP 109/62 | HR 90 | Wt 189.2 lb

## 2017-06-02 DIAGNOSIS — Z3492 Encounter for supervision of normal pregnancy, unspecified, second trimester: Secondary | ICD-10-CM

## 2017-06-02 DIAGNOSIS — O09299 Supervision of pregnancy with other poor reproductive or obstetric history, unspecified trimester: Secondary | ICD-10-CM

## 2017-06-02 DIAGNOSIS — Z131 Encounter for screening for diabetes mellitus: Secondary | ICD-10-CM

## 2017-06-02 DIAGNOSIS — Z113 Encounter for screening for infections with a predominantly sexual mode of transmission: Secondary | ICD-10-CM

## 2017-06-02 DIAGNOSIS — Z130 Encounter for screening for diseases of the blood and blood-forming organs and certain disorders involving the immune mechanism: Secondary | ICD-10-CM

## 2017-06-02 LAB — POCT URINALYSIS DIPSTICK
BILIRUBIN UA: NEGATIVE
GLUCOSE UA: NEGATIVE
KETONES UA: NEGATIVE
Leukocytes, UA: NEGATIVE
Nitrite, UA: NEGATIVE
PH UA: 8.5 — AB (ref 5.0–8.0)
Protein, UA: NEGATIVE
RBC UA: NEGATIVE
Spec Grav, UA: <=1.005 — AB (ref 1.010–1.025)
UROBILINOGEN UA: 0.2 U/dL

## 2017-06-02 NOTE — Progress Notes (Signed)
Angela Mcgrath-Doing well, reports difficulty taking deep breaths with certain fetal positions. Discussed home treatment measures for discomforts of pregnancy. [facility] G[facility] and Pharm Tech [facility]. Naming daughter, Clinton GallantChloe. Desires midwifery care. Anticipatory guidance regarding 28 week labs and course of prenatal care. RTC x 3-4 weeks for 28 week labs and Angela Mcgrath or sooner if needed.

## 2017-06-02 NOTE — Progress Notes (Signed)
Pt is here for an ROB visit. States sometimes its hard to breathe.

## 2017-06-02 NOTE — Patient Instructions (Signed)
WHAT OB PATIENTS CAN EXPECT   Confirmation of pregnancy and ultrasound ordered if medically indicated-[redacted] weeks gestation  New OB (NOB) intake with nurse and New OB (NOB) labs- [redacted] weeks gestation  New OB (NOB) physical examination with provider- 11/[redacted] weeks gestation  Flu vaccine-[redacted] weeks gestation  Anatomy scan-[redacted] weeks gestation  Glucose tolerance test, blood work to test for anemia, T-dap vaccine-[redacted] weeks gestation  Vaginal swabs/cultures-STD/Group B strep-[redacted] weeks gestation  Appointments every 4 weeks until 28 weeks  Every 2 weeks from 28 weeks until 36 weeks  Weekly visits from 36 weeks until delivery  Round Ligament Pain The round ligament is a cord of muscle and tissue that helps to support the uterus. It can become a source of pain during pregnancy if it becomes stretched or twisted as the baby grows. The pain usually begins in the second trimester of pregnancy, and it can come and go until the baby is delivered. It is not a serious problem, and it does not cause harm to the baby. Round ligament pain is usually a short, sharp, and pinching pain, but it can also be a dull, lingering, and aching pain. The pain is felt in the lower side of the abdomen or in the groin. It usually starts deep in the groin and moves up to the outside of the hip area. Pain can occur with:  A sudden change in position.  Rolling over in bed.  Coughing or sneezing.  Physical activity.  Follow these instructions at home: Watch your condition for any changes. Take these steps to help with your pain:  When the pain starts, relax. Then try: ? Sitting down. ? Flexing your knees up to your abdomen. ? Lying on your side with one pillow under your abdomen and another pillow between your legs. ? Sitting in a warm bath for 15-20 minutes or until the pain goes away.  Take over-the-counter and prescription medicines only as told by your health care provider.  Move slowly when you sit and stand.  Avoid  long walks if they cause pain.  Stop or lessen your physical activities if they cause pain.  Contact a health care provider if:  Your pain does not go away with treatment.  You feel pain in your back that you did not have before.  Your medicine is not helping. Get help right away if:  You develop a fever or chills.  You develop uterine contractions.  You develop vaginal bleeding.  You develop nausea or vomiting.  You develop diarrhea.  You have pain when you urinate. This information is not intended to replace advice given to you by your health care provider. Make sure you discuss any questions you have with your health care provider. Document Released: 11/04/2007 Document Revised: 07/03/2015 Document Reviewed: 04/03/2014 Elsevier Interactive Patient Education  2018 Elsevier Inc.  

## 2017-06-05 ENCOUNTER — Encounter: Payer: Self-pay | Admitting: Certified Nurse Midwife

## 2017-06-07 ENCOUNTER — Ambulatory Visit (INDEPENDENT_AMBULATORY_CARE_PROVIDER_SITE_OTHER): Payer: Medicaid Other | Admitting: Obstetrics and Gynecology

## 2017-06-07 ENCOUNTER — Telehealth: Payer: Self-pay | Admitting: Obstetrics and Gynecology

## 2017-06-07 ENCOUNTER — Encounter: Payer: Self-pay | Admitting: Obstetrics and Gynecology

## 2017-06-07 VITALS — BP 116/68 | HR 82 | Wt 191.0 lb

## 2017-06-07 DIAGNOSIS — Z3492 Encounter for supervision of normal pregnancy, unspecified, second trimester: Secondary | ICD-10-CM

## 2017-06-07 DIAGNOSIS — B373 Candidiasis of vulva and vagina: Secondary | ICD-10-CM

## 2017-06-07 DIAGNOSIS — B3731 Acute candidiasis of vulva and vagina: Secondary | ICD-10-CM

## 2017-06-07 LAB — POCT URINALYSIS DIPSTICK
Bilirubin, UA: NEGATIVE
Blood, UA: NEGATIVE
GLUCOSE UA: NEGATIVE
Ketones, UA: NEGATIVE
LEUKOCYTES UA: NEGATIVE
Nitrite, UA: NEGATIVE
Protein, UA: NEGATIVE
Spec Grav, UA: 1.01 (ref 1.010–1.025)
Urobilinogen, UA: 0.2 E.U./dL
pH, UA: 7 (ref 5.0–8.0)

## 2017-06-07 MED ORDER — FLUCONAZOLE 150 MG PO TABS: 150.0000 mg | ORAL_TABLET | Freq: Once | ORAL | 0 refills | Status: AC

## 2017-06-07 NOTE — Progress Notes (Signed)
ROB- OB problem visit, pt is c/o" swollen vagina"

## 2017-06-07 NOTE — Telephone Encounter (Signed)
The patient called and stated that her medication was not sent into her pharmacy. Please advise.

## 2017-06-07 NOTE — Progress Notes (Signed)
Work in [employer]- reports increased odor and vaginal discharge for one week, then felt like there is a split area. No OTC meds used.   Microscopic wet-mount exam shows hyphae, lactobacilli.  Pelvic exam: VULVA: vulvar tenderness & vulvar erythema throughout Vaginal and cenvix clear except increased white discharge.

## 2017-06-07 NOTE — Telephone Encounter (Signed)
Done-ac 

## 2017-06-07 NOTE — Addendum Note (Signed)
Addended by: Rosine Beat L on: 06/07/2017 02:31 PM   Modules accepted: Orders

## 2017-06-10 LAB — NUSWAB VAGINITIS PLUS (VG+)
CHLAMYDIA TRACHOMATIS, NAA: NEGATIVE
Candida albicans, NAA: NEGATIVE
Candida glabrata, NAA: NEGATIVE
Neisseria gonorrhoeae, NAA: NEGATIVE
Trich vag by NAA: NEGATIVE

## 2017-06-13 ENCOUNTER — Encounter: Payer: Self-pay | Admitting: Obstetrics and Gynecology

## 2017-06-30 ENCOUNTER — Ambulatory Visit (INDEPENDENT_AMBULATORY_CARE_PROVIDER_SITE_OTHER): Payer: Medicaid Other | Admitting: Obstetrics and Gynecology

## 2017-06-30 ENCOUNTER — Other Ambulatory Visit: Payer: Medicaid Other

## 2017-06-30 VITALS — BP 106/71 | HR 84 | Wt 196.4 lb

## 2017-06-30 DIAGNOSIS — Z3493 Encounter for supervision of normal pregnancy, unspecified, third trimester: Secondary | ICD-10-CM

## 2017-06-30 LAB — POCT URINALYSIS DIPSTICK
Bilirubin, UA: NEGATIVE
GLUCOSE UA: NEGATIVE
KETONES UA: NEGATIVE
Leukocytes, UA: NEGATIVE
Nitrite, UA: NEGATIVE
Protein, UA: NEGATIVE
RBC UA: NEGATIVE
SPEC GRAV UA: 1.015 (ref 1.010–1.025)
Urobilinogen, UA: 0.2 E.U./dL
pH, UA: 6 (ref 5.0–8.0)

## 2017-06-30 NOTE — Patient Instructions (Signed)

## 2017-06-30 NOTE — Progress Notes (Signed)
ROB and glucola-discussed wearing maternity belt- has one already.

## 2017-06-30 NOTE — Addendum Note (Signed)
Addended by: Smith Mince on: 06/30/2017 10:12 AM   Modules accepted: Orders

## 2017-06-30 NOTE — Progress Notes (Signed)
ROB- glucola done today, blood consent signed, pt wants to wait on tdap until next visit

## 2017-07-01 ENCOUNTER — Encounter: Payer: Self-pay | Admitting: Certified Nurse Midwife

## 2017-07-01 LAB — GLUCOSE, 1 HOUR GESTATIONAL: Gestational Diabetes Screen: 81 mg/dL (ref 65–139)

## 2017-07-01 LAB — CBC
Hematocrit: 35 % (ref 34.0–46.6)
Hemoglobin: 11.5 g/dL (ref 11.1–15.9)
MCH: 29.2 pg (ref 26.6–33.0)
MCHC: 32.9 g/dL (ref 31.5–35.7)
MCV: 89 fL (ref 79–97)
PLATELETS: 213 10*3/uL (ref 150–450)
RBC: 3.94 x10E6/uL (ref 3.77–5.28)
RDW: 13.6 % (ref 12.3–15.4)
WBC: 9 10*3/uL (ref 3.4–10.8)

## 2017-07-01 LAB — RPR: RPR: NONREACTIVE

## 2017-07-11 ENCOUNTER — Encounter: Payer: Self-pay | Admitting: Obstetrics and Gynecology

## 2017-07-14 ENCOUNTER — Encounter: Payer: Self-pay | Admitting: Emergency Medicine

## 2017-07-14 ENCOUNTER — Ambulatory Visit (INDEPENDENT_AMBULATORY_CARE_PROVIDER_SITE_OTHER): Payer: Medicaid Other | Admitting: Certified Nurse Midwife

## 2017-07-14 ENCOUNTER — Other Ambulatory Visit: Payer: Self-pay

## 2017-07-14 ENCOUNTER — Encounter: Payer: Self-pay | Admitting: Certified Nurse Midwife

## 2017-07-14 ENCOUNTER — Observation Stay
Admission: EM | Admit: 2017-07-14 | Discharge: 2017-07-14 | Disposition: A | Discharge status: Home / Self Care | Payer: Medicaid Other | Attending: Certified Nurse Midwife | Admitting: Certified Nurse Midwife

## 2017-07-14 VITALS — BP 128/75 | HR 104 | Wt 196.2 lb

## 2017-07-14 DIAGNOSIS — K589 Irritable bowel syndrome without diarrhea: Secondary | ICD-10-CM | POA: Insufficient documentation

## 2017-07-14 DIAGNOSIS — F329 Major depressive disorder, single episode, unspecified: Secondary | ICD-10-CM | POA: Insufficient documentation

## 2017-07-14 DIAGNOSIS — Z833 Family history of diabetes mellitus: Secondary | ICD-10-CM | POA: Insufficient documentation

## 2017-07-14 DIAGNOSIS — F419 Anxiety disorder, unspecified: Secondary | ICD-10-CM | POA: Diagnosis not present

## 2017-07-14 DIAGNOSIS — Y92481 Parking lot as the place of occurrence of the external cause: Secondary | ICD-10-CM | POA: Diagnosis not present

## 2017-07-14 DIAGNOSIS — Z810 Family history of intellectual disabilities: Secondary | ICD-10-CM | POA: Diagnosis not present

## 2017-07-14 DIAGNOSIS — R1010 Upper abdominal pain, unspecified: Secondary | ICD-10-CM | POA: Diagnosis present

## 2017-07-14 DIAGNOSIS — O26893 Other specified pregnancy related conditions, third trimester: Secondary | ICD-10-CM | POA: Diagnosis not present

## 2017-07-14 DIAGNOSIS — R109 Unspecified abdominal pain: Secondary | ICD-10-CM | POA: Insufficient documentation

## 2017-07-14 DIAGNOSIS — Z825 Family history of asthma and other chronic lower respiratory diseases: Secondary | ICD-10-CM | POA: Diagnosis not present

## 2017-07-14 DIAGNOSIS — Z3493 Encounter for supervision of normal pregnancy, unspecified, third trimester: Secondary | ICD-10-CM

## 2017-07-14 DIAGNOSIS — Z87891 Personal history of nicotine dependence: Secondary | ICD-10-CM | POA: Diagnosis not present

## 2017-07-14 DIAGNOSIS — Z3A30 30 weeks gestation of pregnancy: Secondary | ICD-10-CM | POA: Diagnosis not present

## 2017-07-14 DIAGNOSIS — Z23 Encounter for immunization: Secondary | ICD-10-CM | POA: Diagnosis not present

## 2017-07-14 DIAGNOSIS — Z8249 Family history of ischemic heart disease and other diseases of the circulatory system: Secondary | ICD-10-CM | POA: Diagnosis not present

## 2017-07-14 LAB — POCT URINALYSIS DIPSTICK
BILIRUBIN UA: NEGATIVE
Blood, UA: NEGATIVE
Glucose, UA: NEGATIVE
Ketones, UA: NEGATIVE
LEUKOCYTES UA: NEGATIVE
NITRITE UA: NEGATIVE
PH UA: 8 (ref 5.0–8.0)
PROTEIN UA: NEGATIVE
Spec Grav, UA: <=1.005 — AB (ref 1.010–1.025)
UROBILINOGEN UA: 0.2 U/dL

## 2017-07-14 MED ORDER — TETANUS-DIPHTH-ACELL PERTUSSIS 5-2.5-18.5 LF-MCG/0.5 IM SUSP
0.5000 mL | Freq: Once | INTRAMUSCULAR | Status: AC
  Administered 2017-07-14: 0.5 mL via INTRAMUSCULAR

## 2017-07-14 NOTE — Addendum Note (Signed)
Addended by: Brooke DareSICK, Janna Oak L on: 07/14/2017 02:33 PM   Modules accepted: Orders

## 2017-07-14 NOTE — ED Notes (Signed)
Fetal heart tones heard on the left side of abd. 147 was FHR. Doppler was kicked several times during this process
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
## 2017-07-14 NOTE — OB Triage Note (Signed)
Discharge instructions provided and reviewed.  Follow up care discussed.  Pt verbalized understanding. 

## 2017-07-14 NOTE — Patient Instructions (Addendum)
Back Pain in Pregnancy Back pain during pregnancy is common. Back pain may be caused by several factors that are related to changes during your pregnancy. Follow these instructions at home: Managing pain, stiffness, and swelling  If directed, apply ice for sudden (acute) back pain. ? Put ice in a plastic bag. ? Place a towel between your skin and the bag. ? Leave the ice on for 20 minutes, 2-3 times per day.  If directed, apply heat to the affected area before you exercise: ? Place a towel between your skin and the heat pack or heating pad. ? Leave the heat on for 20-30 minutes. ? Remove the heat if your skin turns bright red. This is especially important if you are unable to feel pain, heat, or cold. You may have a greater risk of getting burned. Activity  Exercise as told by your health care provider. Exercising is the best way to prevent or manage back pain.  Listen to your body when lifting. If lifting hurts, ask for help or bend your knees. This uses your leg muscles instead of your back muscles.  Squat down when picking up something from the floor. Do not bend over.  Only use bed rest as told by your health care provider. Bed rest should only be used for the most severe episodes of back pain. Standing, Sitting, and Lying Down  Do not stand in one place for long periods of time.  Use good posture when sitting. Make sure your head rests over your shoulders and is not hanging forward. Use a pillow on your lower back if necessary.  Try sleeping on your side, preferably the left side, with a pillow or two between your legs. If you are sore after a night's rest, your bed may be too soft. A firm mattress may provide more support for your back during pregnancy. General instructions  Do not wear high heels.  Eat a healthy diet. Try to gain weight within your health care provider's recommendations.  Use a maternity girdle, elastic sling, or back brace as told by your health care  provider.  Take over-the-counter and prescription medicines only as told by your health care provider.  Keep all follow-up visits as told by your health care provider. This is important. This includes any visits with any specialists, such as a physical therapist. Contact a health care provider if:  Your back pain interferes with your daily activities.  You have increasing pain in other parts of your body. Get help right away if:  You develop numbness, tingling, weakness, or problems with the use of your arms or legs.  You develop severe back pain that is not controlled with medicine.  You have a sudden change in bowel or bladder control.  You develop shortness of breath, dizziness, or you faint.  You develop nausea, vomiting, or sweating.  You have back pain that is a rhythmic, cramping pain similar to labor pains. Labor pain is usually 1-2 minutes apart, lasts for about 1 minute, and involves a bearing down feeling or pressure in your pelvis.  You have back pain and your water breaks or you have vaginal bleeding.  You have back pain or numbness that travels down your leg.  Your back pain developed after you fell.  You develop pain on one side of your back.  You see blood in your urine.  You develop skin blisters in the area of your back pain. This information is not intended to replace advice given to you   by your health care provider. Make sure you discuss any questions you have with your health care provider. Document Released: 05/05/2005 Document Revised: 07/03/2015 Document Reviewed: 10/09/2014 Elsevier Interactive Patient Education  2018 Elsevier Inc.  Common Medications Safe in Pregnancy  Acne:      Constipation:  Benzoyl Peroxide     Colace  Clindamycin      Dulcolax Suppository  Topica Erythromycin     Fibercon  Salicylic Acid      Metamucil         Miralax AVOID:        Senakot   Accutane    Cough:  Retin-A       Cough Drops  Tetracycline      Phenergan w/  Codeine if Rx  Minocycline      Robitussin (Plain & DM)  Antibiotics:     Crabs/Lice:  Ceclor       RID  Cephalosporins    AVOID:  E-Mycins      Kwell  Keflex  Macrobid/Macrodantin   Diarrhea:  Penicillin      Kao-Pectate  Zithromax      Imodium AD         PUSH FLUIDS AVOID:       Cipro     Fever:  Tetracycline      Tylenol (Regular or Extra  Minocycline       Strength)  Levaquin      Extra Strength-Do not          Exceed 8 tabs/24 hrs Caffeine:        <200mg/day (equiv. To 1 cup of coffee or  approx. 3 12 oz sodas)         Gas: Cold/Hayfever:       Gas-X  Benadryl      Mylicon  Claritin       Phazyme  **Claritin-D        Chlor-Trimeton    Headaches:  Dimetapp      ASA-Free Excedrin  Drixoral-Non-Drowsy     Cold Compress  Mucinex (Guaifenasin)     Tylenol (Regular or Extra  Sudafed/Sudafed-12 Hour     Strength)  **Sudafed PE Pseudoephedrine   Tylenol Cold & Sinus     Vicks Vapor Rub  Zyrtec  **AVOID if Problems With Blood Pressure         Heartburn: Avoid lying down for at least 1 hour after meals  Aciphex      Maalox     Rash:  Milk of Magnesia     Benadryl    Mylanta       1% Hydrocortisone Cream  Pepcid  Pepcid Complete   Sleep Aids:  Prevacid      Ambien   Prilosec       Benadryl  Rolaids       Chamomile Tea  Tums (Limit 4/day)     Unisom  Zantac       Tylenol PM         Warm milk-add vanilla or  Hemorrhoids:       Sugar for taste  Anusol/Anusol H.C.  (RX: Analapram 2.5%)  Sugar Substitutes:  Hydrocortisone OTC     Ok in moderation  Preparation H      Tucks        Vaseline lotion applied to tissue with wiping    Herpes:     Throat:  Acyclovir      Oragel  Famvir  Valtrex     Vaccines:           Flu Shot Leg Cramps:       *Gardasil  Benadryl      Hepatitis A         Hepatitis B Nasal Spray:       Pneumovax  Saline Nasal Spray     Polio Booster         Tetanus Nausea:       Tuberculosis test or PPD  Vitamin B6 25 mg  TID   AVOID:    Dramamine      *Gardasil  Emetrol       Live Poliovirus  Ginger Root 250 mg QID    MMR (measles, mumps &  High Complex Carbs @ Bedtime    rebella)  Sea Bands-Accupressure    Varicella (Chickenpox)  Unisom 1/2 tab TID     *No known complications           If received before Pain:         Known pregnancy;   Darvocet       Resume series after  Lortab        Delivery  Percocet    Yeast:   Tramadol      Femstat  Tylenol 3      Gyne-lotrimin  Ultram       Monistat  Vicodin           MISC:         All Sunscreens           Hair Coloring/highlights          Insect Repellant's          (Including DEET)         Mystic Tans  Cord Blood Banking Information Cord blood banking is the process of collecting and storing the blood that is in the umbilical cord and placenta at the time of delivery. This blood contains stem cells, which can be used to treat many blood diseases, immune system disorders, and childhood cancers. Stem cells can also be used to research certain diseases and treatments. Many people who choose cord blood banking donate the blood. Donated blood can be used in lifesaving treatments or for research. Other people choose to store the blood privately. Blood that is stored privately can only be used with the person's permission. This option is often chosen if:  A family member needs a stem cell transplant.  The child is part of an ethnic minority.  The child was conceived through in vitro fertilization.  What should I look for in a blood bank? A blood bank is the organization that coordinates cord blood banking. Make sure the cord blood bank that you use:  Is accredited.  Is financially stable.  Handles a large volume of cord blood samples.  Has a procedure in place for transport and storage.  Allows you the option of transferring your cord blood sample.  Has a procedure in place if the bank goes out of business.  Clearly states all costs and limits to  future costs.  People who choose to donate cord blood should not need to pay for blood banking. People who keep the blood for private use will need to pay for the first (initial) storage and pay a fee each year (annual fee). Other fees may also apply. What are the risks of cord blood banking? There are no health risks associated with cord blood banking. It is considered safe. How should I prepare? You must schedule this process at least 4-6 weeks before you will  be giving birth. How is the blood collected? The blood is collected as soon as the baby has been delivered. Within 15 minutes of delivery, a health care provider will take these actions to collect the blood:  Clamp the umbilical cord at the top and bottom. This traps the blood in the umbilical cord.  Use a syringe or bag to collect the blood.  Insert needles into the placenta to collect (draw out) more blood.  What happens after the blood is collected? After the blood has been collected:  The blood will be sent to a blood bank.  The blood will be tested for genetic problems and infectious diseases. If the blood tests positive for a genetic problem or a disease, someone will contact you and let you know.  The blood will be frozen.  If your child develops a genetic condition, immune system disorder, or cancer, you will be responsible for contacting the blood bank and letting them know. This information is not intended to replace advice given to you by your health care provider. Make sure you discuss any questions you have with your health care provider. Document Released: 07/15/2009 Document Revised: 07/03/2015 Document Reviewed: 07/15/2014 Elsevier Interactive Patient Education  2018 [employer].

## 2017-07-14 NOTE — Progress Notes (Signed)
Pt is here for an ROB visit. 
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
## 2017-07-14 NOTE — Discharge Instructions (Addendum)
Please seek medical attention for any high fevers, chest pain, shortness of breath, change in behavior, persistent vomiting, bloody stool or any other new or concerning symptoms.  

## 2017-07-14 NOTE — ED Notes (Signed)
Per L&D pt to be cleared by ED provider.
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
## 2017-07-14 NOTE — Addendum Note (Signed)
Addended by: Brooke DareSICK, Leanndra Pember L on: 07/14/2017 03:08 PM   Modules accepted: Orders

## 2017-07-14 NOTE — Progress Notes (Signed)
ROB-Doing well. Answered questions regarding cord blood banking; pamphlet given. TDaP given. Reviewed red flag symptoms and when to call. RTC x 2 weeks of ROB or sooner if needed.

## 2017-07-19 ENCOUNTER — Encounter: Payer: Self-pay | Admitting: Certified Nurse Midwife

## 2017-07-27 ENCOUNTER — Encounter: Payer: Self-pay | Admitting: Certified Nurse Midwife

## 2017-07-27 ENCOUNTER — Encounter: Payer: Self-pay | Admitting: Obstetrics and Gynecology

## 2017-07-29 ENCOUNTER — Encounter: Payer: Self-pay | Admitting: Certified Nurse Midwife

## 2017-07-29 ENCOUNTER — Ambulatory Visit (INDEPENDENT_AMBULATORY_CARE_PROVIDER_SITE_OTHER): Payer: Medicaid Other | Admitting: Certified Nurse Midwife

## 2017-07-29 VITALS — BP 95/68 | HR 103 | Wt 198.4 lb

## 2017-07-29 DIAGNOSIS — N898 Other specified noninflammatory disorders of vagina: Secondary | ICD-10-CM

## 2017-07-29 DIAGNOSIS — Z3493 Encounter for supervision of normal pregnancy, unspecified, third trimester: Secondary | ICD-10-CM

## 2017-07-29 LAB — POCT URINALYSIS DIPSTICK
Bilirubin, UA: NEGATIVE
GLUCOSE UA: NEGATIVE
Ketones, UA: NEGATIVE
LEUKOCYTES UA: NEGATIVE
Nitrite, UA: NEGATIVE
PH UA: 8 (ref 5.0–8.0)
Protein, UA: POSITIVE — AB
RBC UA: NEGATIVE
SPEC GRAV UA: 1.01 (ref 1.010–1.025)
Urobilinogen, UA: 0.2 E.U./dL

## 2017-07-29 NOTE — Progress Notes (Signed)
ROB, doing well. Pt complains of hemorrhoid and increased vaginal discharge with yellow/orange color. Nuswab today. She states she has done home remidies for the rectal pain. She has not used any medications. Encouraged Anusol cream , warm baths, ice to bottom. Also encouraged use of stool softner. She verbalizes and agrees to plan. Follow up 2 wks.   Doreene BurkeAnnie Maresha Anastos, CNM

## 2017-07-29 NOTE — Progress Notes (Signed)
Pt is here for an ROB visit. Also c/o discharge and would like it to be checked and states she has hemorrhoids that are very painful.

## 2017-07-29 NOTE — Patient Instructions (Signed)
Group B Streptococcus Infection During Pregnancy Group B Streptococcus (GBS) is a type of bacteria (Streptococcus agalactiae) that is often found in healthy people, commonly in the rectum, vagina, and intestines. In people who are healthy and not pregnant, the bacteria rarely cause serious illness or complications. However, women who test positive for GBS during pregnancy can pass the bacteria to their baby during childbirth, which can cause serious infection in the baby after birth. Women with GBS may also have infections during their pregnancy or immediately after childbirth, such as such as urinary tract infections (UTIs) or infections of the uterus (uterine infections). Having GBS also increases a woman's risk of complications during pregnancy, such as early (preterm) labor or delivery, miscarriage, or stillbirth. Routine testing (screening) for GBS is recommended for all pregnant women. What increases the risk? You may have a higher risk for GBS infection during pregnancy if you had one during a past pregnancy. What are the signs or symptoms? In most cases, GBS infection does not cause symptoms in pregnant women. Signs and symptoms of a possible GBS-related infection may include:  Labor starting before the 37th week of pregnancy.  A UTI or bladder infection, which may cause: ? Fever. ? Pain or burning during urination. ? Frequent urination.  Fever during labor, along with: ? Bad-smelling discharge. ? Uterine tenderness. ? Rapid heartbeat in the mother, baby, or both.  Rare but serious symptoms of a possible GBS-related infection in women include:  Blood infection (septicemia). This may cause fever, chills, or confusion.  Lung infection (pneumonia). This may cause fever, chills, cough, rapid breathing, difficulty breathing, or chest pain.  Bone, joint, skin, or soft tissue infection.  How is this diagnosed? You may be screened for GBS between week 35 and week 37 of your pregnancy. If  you have symptoms of preterm labor, you may be screened earlier. This condition is diagnosed based on lab test results from:  A swab of fluid from the vagina and rectum.  A urine sample.  How is this treated? This condition is treated with antibiotic medicine. When you go into labor, or as soon as your water breaks (your membranes rupture), you will be given antibiotics through an IV tube. Antibiotics will continue until after you give birth. If you are having a cesarean delivery, you do not need antibiotics unless your membranes have already ruptured. Follow these instructions at home:  Take over-the-counter and prescription medicines only as told by your health care provider.  Take your antibiotic medicine as told by your health care provider. Do not stop taking the antibiotic even if you start to feel better.  Keep all pre-birth (prenatal) visits and follow-up visits as told by your health care provider. This is important. Contact a health care provider if:  You have pain or burning when you urinate.  You have to urinate frequently.  You have a fever or chills.  You develop a bad-smelling vaginal discharge. Get help right away if:  Your membranes rupture.  You go into labor.  You have severe pain in your abdomen.  You have difficulty breathing.  You have chest pain. This information is not intended to replace advice given to you by your health care provider. Make sure you discuss any questions you have with your health care provider. Document Released: 05/04/2007 Document Revised: 08/22/2015 Document Reviewed: 08/21/2015 Elsevier Interactive Patient Education  2018 [employer]  

## 2017-08-02 ENCOUNTER — Encounter (INDEPENDENT_AMBULATORY_CARE_PROVIDER_SITE_OTHER): Payer: Self-pay

## 2017-08-02 LAB — NUSWAB BV AND CANDIDA, NAA
CANDIDA ALBICANS, NAA: NEGATIVE
CANDIDA GLABRATA, NAA: NEGATIVE

## 2017-08-04 ENCOUNTER — Encounter: Payer: Self-pay | Admitting: Certified Nurse Midwife

## 2017-08-04 ENCOUNTER — Other Ambulatory Visit: Payer: Self-pay

## 2017-08-15 ENCOUNTER — Encounter: Payer: Self-pay | Admitting: Certified Nurse Midwife

## 2017-08-15 ENCOUNTER — Ambulatory Visit (INDEPENDENT_AMBULATORY_CARE_PROVIDER_SITE_OTHER): Payer: Medicaid Other | Admitting: Certified Nurse Midwife

## 2017-08-15 VITALS — BP 107/68 | HR 107 | Wt 199.8 lb

## 2017-08-15 DIAGNOSIS — Z3493 Encounter for supervision of normal pregnancy, unspecified, third trimester: Secondary | ICD-10-CM | POA: Diagnosis not present

## 2017-08-15 LAB — POCT URINALYSIS DIPSTICK
Blood, UA: NEGATIVE
Glucose, UA: NEGATIVE
KETONES UA: 15
Leukocytes, UA: NEGATIVE
Nitrite, UA: NEGATIVE
Protein, UA: NEGATIVE
Spec Grav, UA: 1.015 (ref 1.010–1.025)
Urobilinogen, UA: 0.2 E.U./dL
pH, UA: 6 (ref 5.0–8.0)

## 2017-08-15 NOTE — Progress Notes (Signed)
ROB- pt is having some pelvic pressure, low back pain 

## 2017-08-15 NOTE — Patient Instructions (Signed)
Vaginal Delivery Vaginal delivery means that you will give birth by pushing your baby out of your birth canal (vagina). A team of health care providers will help you before, during, and after vaginal delivery. Birth experiences are unique for every woman and every pregnancy, and birth experiences vary depending on where you choose to give birth. What should I do to prepare for my baby's birth? Before your baby is born, it is important to talk with your health care provider about:  Your labor and delivery preferences. These may include: ? Medicines that you may be given. ? How you will manage your pain. This might include non-medical pain relief techniques or injectable pain relief such as epidural analgesia. ? How you and your baby will be monitored during labor and delivery. ? Who may be in the labor and delivery room with you. ? Your feelings about surgical delivery of your baby (cesarean delivery, or C-section) if this becomes necessary. ? Your feelings about receiving donated blood through an IV tube (blood transfusion) if this becomes necessary.  Whether you are able: ? To take pictures or videos of the birth. ? To eat during labor and delivery. ? To move around, walk, or change positions during labor and delivery.  What to expect after your baby is born, such as: ? Whether delayed umbilical cord clamping and cutting is offered. ? Who will care for your baby right after birth. ? Medicines or tests that may be recommended for your baby. ? Whether breastfeeding is supported in your hospital or birth center. ? How long you will be in the hospital or birth center.  How any medical conditions you have may affect your baby or your labor and delivery experience.  To prepare for your baby's birth, you should also:  Attend all of your health care visits before delivery (prenatal visits) as recommended by your health care provider. This is important.  Prepare your home for your baby's  arrival. Make sure that you have: ? Diapers. ? Baby clothing. ? Feeding equipment. ? Safe sleeping arrangements for you and your baby.  Install a car seat in your vehicle. Have your car seat checked by a certified car seat installer to make sure that it is installed safely.  Think about who will help you with your new baby at home for at least the first several weeks after delivery.  What can I expect when I arrive at the birth center or hospital? Once you are in labor and have been admitted into the hospital or birth center, your health care provider may:  Review your pregnancy history and any concerns you have.  Insert an IV tube into one of your veins. This is used to give you fluids and medicines.  Check your blood pressure, pulse, temperature, and heart rate (vital signs).  Check whether your bag of water (amniotic sac) has broken (ruptured).  Talk with you about your birth plan and discuss pain control options.  Monitoring Your health care provider may monitor your contractions (uterine monitoring) and your baby's heart rate (fetal monitoring). You may need to be monitored:  Often, but not continuously (intermittently).  All the time or for long periods at a time (continuously). Continuous monitoring may be needed if: ? You are taking certain medicines, such as medicine to relieve pain or make your contractions stronger. ? You have pregnancy or labor complications.  Monitoring may be done by:  Placing a special stethoscope or a handheld monitoring device on your abdomen to   check your baby's heartbeat, and feeling your abdomen for contractions. This method of monitoring does not continuously record your baby's heartbeat or your contractions.  Placing monitors on your abdomen (external monitors) to record your baby's heartbeat and the frequency and length of contractions. You may not have to wear external monitors all the time.  Placing monitors inside of your uterus  (internal monitors) to record your baby's heartbeat and the frequency, length, and strength of your contractions. ? Your health care provider may use internal monitors if he or she needs more information about the strength of your contractions or your baby's heart rate. ? Internal monitors are put in place by passing a thin, flexible wire through your vagina and into your uterus. Depending on the type of monitor, it may remain in your uterus or on your baby's head until birth. ? Your health care provider will discuss the benefits and risks of internal monitoring with you and will ask for your permission before inserting the monitors.  Telemetry. This is a type of continuous monitoring that can be done with external or internal monitors. Instead of having to stay in bed, you are able to move around during telemetry. Ask your health care provider if telemetry is an option for you.  Physical exam Your health care provider may perform a physical exam. This may include:  Checking whether your baby is positioned: ? With the head toward your vagina (head-down). This is most common. ? With the head toward the top of your uterus (head-up or breech). If your baby is in a breech position, your health care provider may try to turn your baby to a head-down position so you can deliver vaginally. If it does not seem that your baby can be born vaginally, your provider may recommend surgery to deliver your baby. In rare cases, you may be able to deliver vaginally if your baby is head-up (breech delivery). ? Lying sideways (transverse). Babies that are lying sideways cannot be delivered vaginally.  Checking your cervix to determine: ? Whether it is thinning out (effacing). ? Whether it is opening up (dilating). ? How low your baby has moved into your birth canal.  What are the three stages of labor and delivery?  Normal labor and delivery is divided into the following three stages: Stage 1  Stage 1 is the  longest stage of labor, and it can last for hours or days. Stage 1 includes: ? Early labor. This is when contractions may be irregular, or regular and mild. Generally, early labor contractions are more than 10 minutes apart. ? Active labor. This is when contractions get longer, more regular, more frequent, and more intense. ? The transition phase. This is when contractions happen very close together, are very intense, and may last longer than during any other part of labor.  Contractions generally feel mild, infrequent, and irregular at first. They get stronger, more frequent (about every 2-3 minutes), and more regular as you progress from early labor through active labor and transition.  Many women progress through stage 1 naturally, but you may need help to continue making progress. If this happens, your health care provider may talk with you about: ? Rupturing your amniotic sac if it has not ruptured yet. ? Giving you medicine to help make your contractions stronger and more frequent.  Stage 1 ends when your cervix is completely dilated to 4 inches (10 cm) and completely effaced. This happens at the end of the transition phase. Stage 2  Once   your cervix is completely effaced and dilated to 4 inches (10 cm), you may start to feel an urge to push. It is common for the body to naturally take a rest before feeling the urge to push, especially if you received an epidural or certain other pain medicines. This rest period may last for up to 1-2 hours, depending on your unique labor experience.  During stage 2, contractions are generally less painful, because pushing helps relieve contraction pain. Instead of contraction pain, you may feel stretching and burning pain, especially when the widest part of your baby's head passes through the vaginal opening (crowning).  Your health care provider will closely monitor your pushing progress and your baby's progress through the vagina during stage 2.  Your  health care provider may massage the area of skin between your vaginal opening and anus (perineum) or apply warm compresses to your perineum. This helps it stretch as the baby's head starts to crown, which can help prevent perineal tearing. ? In some cases, an incision may be made in your perineum (episiotomy) to allow the baby to pass through the vaginal opening. An episiotomy helps to make the opening of the vagina larger to allow more room for the baby to fit through.  It is very important to breathe and focus so your health care provider can control the delivery of your baby's head. Your health care provider may have you decrease the intensity of your pushing, to help prevent perineal tearing.  After delivery of your baby's head, the shoulders and the rest of the body generally deliver very quickly and without difficulty.  Once your baby is delivered, the umbilical cord may be cut right away, or this may be delayed for 1-2 minutes, depending on your baby's health. This may vary among health care providers, hospitals, and birth centers.  If you and your baby are healthy enough, your baby may be placed on your chest or abdomen to help maintain the baby's temperature and to help you bond with each other. Some mothers and babies start breastfeeding at this time. Your health care team will dry your baby and help keep your baby warm during this time.  Your baby may need immediate care if he or she: ? Showed signs of distress during labor. ? Has a medical condition. ? Was born too early (prematurely). ? Had a bowel movement before birth (meconium). ? Shows signs of difficulty transitioning from being inside the uterus to being outside of the uterus. If you are planning to breastfeed, your health care team will help you begin a feeding. Stage 3  The third stage of labor starts immediately after the birth of your baby and ends after you deliver the placenta. The placenta is an organ that develops  during pregnancy to provide oxygen and nutrients to your baby in the womb.  Delivering the placenta may require some pushing, and you may have mild contractions. Breastfeeding can stimulate contractions to help you deliver the placenta.  After the placenta is delivered, your uterus should tighten (contract) and become firm. This helps to stop bleeding in your uterus. To help your uterus contract and to control bleeding, your health care provider may: ? Give you medicine by injection, through an IV tube, by mouth, or through your rectum (rectally). ? Massage your abdomen or perform a vaginal exam to remove any blood clots that are left in your uterus. ? Empty your bladder by placing a thin, flexible tube (catheter) into your bladder. ? Encourage   you to breastfeed your baby. After labor is over, you and your baby will be monitored closely to ensure that you are both healthy until you are ready to go home. Your health care team will teach you how to care for yourself and your baby. This information is not intended to replace advice given to you by your health care provider. Make sure you discuss any questions you have with your health care provider. Document Released: 11/04/2007 Document Revised: 08/15/2015 Document Reviewed: 02/09/2015 Elsevier Interactive Patient Education  2018 [employer]  

## 2017-08-15 NOTE — Progress Notes (Signed)
ROB-Doing well, reports intermittent pelvic pain. Discussed home treatment measures. Birth plan reviewed and signed. Anticipatory guidance regarding course of prenatal care. Herbal prep handout given. Reviewed red flag symptoms and when to call. RTC x 1 week for 36 week cultures and ROB or sooner if needed.

## 2017-08-17 ENCOUNTER — Other Ambulatory Visit: Payer: Self-pay | Admitting: Certified Nurse Midwife

## 2017-08-17 DIAGNOSIS — O9989 Other specified diseases and conditions complicating pregnancy, childbirth and the puerperium: Secondary | ICD-10-CM

## 2017-08-17 DIAGNOSIS — M549 Dorsalgia, unspecified: Secondary | ICD-10-CM

## 2017-08-17 DIAGNOSIS — M25559 Pain in unspecified hip: Secondary | ICD-10-CM

## 2017-08-17 DIAGNOSIS — O99891 Other specified diseases and conditions complicating pregnancy: Secondary | ICD-10-CM

## 2017-08-17 NOTE — Progress Notes (Signed)
Referral placed for chiropractor and pelvic floor therapy due to pelvic pain/back pain in pregnancy   Angela BurkeAnnie Dailah Mcgrath, CNM

## 2017-08-22 ENCOUNTER — Other Ambulatory Visit (HOSPITAL_COMMUNITY)
Admission: RE | Admit: 2017-08-22 | Discharge: 2017-08-22 | Disposition: A | Discharge status: Home / Self Care | Payer: Medicaid Other | Source: Ambulatory Visit | Attending: Certified Nurse Midwife | Admitting: Certified Nurse Midwife

## 2017-08-22 ENCOUNTER — Ambulatory Visit (INDEPENDENT_AMBULATORY_CARE_PROVIDER_SITE_OTHER): Payer: Medicaid Other | Admitting: Certified Nurse Midwife

## 2017-08-22 ENCOUNTER — Encounter: Payer: Self-pay | Admitting: Certified Nurse Midwife

## 2017-08-22 VITALS — BP 98/59 | HR 83 | Wt 199.8 lb

## 2017-08-22 DIAGNOSIS — Z3483 Encounter for supervision of other normal pregnancy, third trimester: Secondary | ICD-10-CM

## 2017-08-22 DIAGNOSIS — Z113 Encounter for screening for infections with a predominantly sexual mode of transmission: Secondary | ICD-10-CM | POA: Diagnosis not present

## 2017-08-22 DIAGNOSIS — Z3493 Encounter for supervision of normal pregnancy, unspecified, third trimester: Secondary | ICD-10-CM

## 2017-08-22 DIAGNOSIS — Z3685 Encounter for antenatal screening for Streptococcus B: Secondary | ICD-10-CM

## 2017-08-22 LAB — POCT URINALYSIS DIPSTICK
Bilirubin, UA: NEGATIVE
Blood, UA: NEGATIVE
Glucose, UA: NEGATIVE
KETONES UA: NEGATIVE
Leukocytes, UA: NEGATIVE
NITRITE UA: NEGATIVE
PROTEIN UA: NEGATIVE
Spec Grav, UA: 1.01 (ref 1.010–1.025)
Urobilinogen, UA: 0.2 E.U./dL
pH, UA: 7 (ref 5.0–8.0)

## 2017-08-22 NOTE — Patient Instructions (Signed)
Group B Streptococcus Infection During Pregnancy Group B Streptococcus (GBS) is a type of bacteria (Streptococcus agalactiae) that is often found in healthy people, commonly in the rectum, vagina, and intestines. In people who are healthy and not pregnant, the bacteria rarely cause serious illness or complications. However, women who test positive for GBS during pregnancy can pass the bacteria to their baby during childbirth, which can cause serious infection in the baby after birth. Women with GBS may also have infections during their pregnancy or immediately after childbirth, such as such as urinary tract infections (UTIs) or infections of the uterus (uterine infections). Having GBS also increases a woman's risk of complications during pregnancy, such as early (preterm) labor or delivery, miscarriage, or stillbirth. Routine testing (screening) for GBS is recommended for all pregnant women. What increases the risk? You may have a higher risk for GBS infection during pregnancy if you had one during a past pregnancy. What are the signs or symptoms? In most cases, GBS infection does not cause symptoms in pregnant women. Signs and symptoms of a possible GBS-related infection may include:  Labor starting before the 37th week of pregnancy.  A UTI or bladder infection, which may cause: ? Fever. ? Pain or burning during urination. ? Frequent urination.  Fever during labor, along with: ? Bad-smelling discharge. ? Uterine tenderness. ? Rapid heartbeat in the mother, baby, or both.  Rare but serious symptoms of a possible GBS-related infection in women include:  Blood infection (septicemia). This may cause fever, chills, or confusion.  Lung infection (pneumonia). This may cause fever, chills, cough, rapid breathing, difficulty breathing, or chest pain.  Bone, joint, skin, or soft tissue infection.  How is this diagnosed? You may be screened for GBS between week 35 and week 37 of your pregnancy. If  you have symptoms of preterm labor, you may be screened earlier. This condition is diagnosed based on lab test results from:  A swab of fluid from the vagina and rectum.  A urine sample.  How is this treated? This condition is treated with antibiotic medicine. When you go into labor, or as soon as your water breaks (your membranes rupture), you will be given antibiotics through an IV tube. Antibiotics will continue until after you give birth. If you are having a cesarean delivery, you do not need antibiotics unless your membranes have already ruptured. Follow these instructions at home:  Take over-the-counter and prescription medicines only as told by your health care provider.  Take your antibiotic medicine as told by your health care provider. Do not stop taking the antibiotic even if you start to feel better.  Keep all pre-birth (prenatal) visits and follow-up visits as told by your health care provider. This is important. Contact a health care provider if:  You have pain or burning when you urinate.  You have to urinate frequently.  You have a fever or chills.  You develop a bad-smelling vaginal discharge. Get help right away if:  Your membranes rupture.  You go into labor.  You have severe pain in your abdomen.  You have difficulty breathing.  You have chest pain. This information is not intended to replace advice given to you by your health care provider. Make sure you discuss any questions you have with your health care provider. Document Released: 05/04/2007 Document Revised: 08/22/2015 Document Reviewed: 08/21/2015 Elsevier Interactive Patient Education  2018 [employer]  

## 2017-08-22 NOTE — Progress Notes (Signed)
ROB- cultures obtained, B hip pain

## 2017-08-22 NOTE — Progress Notes (Signed)
ROB, doing well. GBS and cultures today. Feels good movement. SVE 1.5/40/-3. ROB in one week.   Doreene BurkeAnnie Tiffany Calmes, CNM

## 2017-08-24 ENCOUNTER — Encounter (INDEPENDENT_AMBULATORY_CARE_PROVIDER_SITE_OTHER): Payer: Self-pay

## 2017-08-24 LAB — STREP GP B NAA: Strep Gp B NAA: NEGATIVE

## 2017-08-24 LAB — GC/CHLAMYDIA PROBE AMP (~~LOC~~) NOT AT ARMC
CHLAMYDIA, DNA PROBE: NEGATIVE
Neisseria Gonorrhea: NEGATIVE

## 2017-08-26 ENCOUNTER — Encounter: Payer: Self-pay | Admitting: Certified Nurse Midwife

## 2017-08-29 ENCOUNTER — Ambulatory Visit (INDEPENDENT_AMBULATORY_CARE_PROVIDER_SITE_OTHER): Payer: Medicaid Other | Admitting: Certified Nurse Midwife

## 2017-08-29 VITALS — BP 120/79 | HR 104 | Wt 200.6 lb

## 2017-08-29 DIAGNOSIS — Z3493 Encounter for supervision of normal pregnancy, unspecified, third trimester: Secondary | ICD-10-CM

## 2017-08-29 LAB — POCT URINALYSIS DIPSTICK
BILIRUBIN UA: NEGATIVE
Glucose, UA: NEGATIVE
Ketones, UA: 15
LEUKOCYTES UA: NEGATIVE
Nitrite, UA: NEGATIVE
PH UA: 6.5 (ref 5.0–8.0)
Protein, UA: NEGATIVE
RBC UA: NEGATIVE
SPEC GRAV UA: 1.015 (ref 1.010–1.025)
UROBILINOGEN UA: 0.2 U/dL

## 2017-08-29 NOTE — Progress Notes (Signed)
ROB- pt is having some pelvic pressure 

## 2017-08-29 NOTE — Patient Instructions (Signed)
Vaginal Delivery Vaginal delivery means that you will give birth by pushing your baby out of your birth canal (vagina). A team of health care providers will help you before, during, and after vaginal delivery. Birth experiences are unique for every woman and every pregnancy, and birth experiences vary depending on where you choose to give birth. What should I do to prepare for my baby's birth? Before your baby is born, it is important to talk with your health care provider about:  Your labor and delivery preferences. These may include: ? Medicines that you may be given. ? How you will manage your pain. This might include non-medical pain relief techniques or injectable pain relief such as epidural analgesia. ? How you and your baby will be monitored during labor and delivery. ? Who may be in the labor and delivery room with you. ? Your feelings about surgical delivery of your baby (cesarean delivery, or C-section) if this becomes necessary. ? Your feelings about receiving donated blood through an IV tube (blood transfusion) if this becomes necessary.  Whether you are able: ? To take pictures or videos of the birth. ? To eat during labor and delivery. ? To move around, walk, or change positions during labor and delivery.  What to expect after your baby is born, such as: ? Whether delayed umbilical cord clamping and cutting is offered. ? Who will care for your baby right after birth. ? Medicines or tests that may be recommended for your baby. ? Whether breastfeeding is supported in your hospital or birth center. ? How long you will be in the hospital or birth center.  How any medical conditions you have may affect your baby or your labor and delivery experience.  To prepare for your baby's birth, you should also:  Attend all of your health care visits before delivery (prenatal visits) as recommended by your health care provider. This is important.  Prepare your home for your baby's  arrival. Make sure that you have: ? Diapers. ? Baby clothing. ? Feeding equipment. ? Safe sleeping arrangements for you and your baby.  Install a car seat in your vehicle. Have your car seat checked by a certified car seat installer to make sure that it is installed safely.  Think about who will help you with your new baby at home for at least the first several weeks after delivery.  What can I expect when I arrive at the birth center or hospital? Once you are in labor and have been admitted into the hospital or birth center, your health care provider may:  Review your pregnancy history and any concerns you have.  Insert an IV tube into one of your veins. This is used to give you fluids and medicines.  Check your blood pressure, pulse, temperature, and heart rate (vital signs).  Check whether your bag of water (amniotic sac) has broken (ruptured).  Talk with you about your birth plan and discuss pain control options.  Monitoring Your health care provider may monitor your contractions (uterine monitoring) and your baby's heart rate (fetal monitoring). You may need to be monitored:  Often, but not continuously (intermittently).  All the time or for long periods at a time (continuously). Continuous monitoring may be needed if: ? You are taking certain medicines, such as medicine to relieve pain or make your contractions stronger. ? You have pregnancy or labor complications.  Monitoring may be done by:  Placing a special stethoscope or a handheld monitoring device on your abdomen to   check your baby's heartbeat, and feeling your abdomen for contractions. This method of monitoring does not continuously record your baby's heartbeat or your contractions.  Placing monitors on your abdomen (external monitors) to record your baby's heartbeat and the frequency and length of contractions. You may not have to wear external monitors all the time.  Placing monitors inside of your uterus  (internal monitors) to record your baby's heartbeat and the frequency, length, and strength of your contractions. ? Your health care provider may use internal monitors if he or she needs more information about the strength of your contractions or your baby's heart rate. ? Internal monitors are put in place by passing a thin, flexible wire through your vagina and into your uterus. Depending on the type of monitor, it may remain in your uterus or on your baby's head until birth. ? Your health care provider will discuss the benefits and risks of internal monitoring with you and will ask for your permission before inserting the monitors.  Telemetry. This is a type of continuous monitoring that can be done with external or internal monitors. Instead of having to stay in bed, you are able to move around during telemetry. Ask your health care provider if telemetry is an option for you.  Physical exam Your health care provider may perform a physical exam. This may include:  Checking whether your baby is positioned: ? With the head toward your vagina (head-down). This is most common. ? With the head toward the top of your uterus (head-up or breech). If your baby is in a breech position, your health care provider may try to turn your baby to a head-down position so you can deliver vaginally. If it does not seem that your baby can be born vaginally, your provider may recommend surgery to deliver your baby. In rare cases, you may be able to deliver vaginally if your baby is head-up (breech delivery). ? Lying sideways (transverse). Babies that are lying sideways cannot be delivered vaginally.  Checking your cervix to determine: ? Whether it is thinning out (effacing). ? Whether it is opening up (dilating). ? How low your baby has moved into your birth canal.  What are the three stages of labor and delivery?  Normal labor and delivery is divided into the following three stages: Stage 1  Stage 1 is the  longest stage of labor, and it can last for hours or days. Stage 1 includes: ? Early labor. This is when contractions may be irregular, or regular and mild. Generally, early labor contractions are more than 10 minutes apart. ? Active labor. This is when contractions get longer, more regular, more frequent, and more intense. ? The transition phase. This is when contractions happen very close together, are very intense, and may last longer than during any other part of labor.  Contractions generally feel mild, infrequent, and irregular at first. They get stronger, more frequent (about every 2-3 minutes), and more regular as you progress from early labor through active labor and transition.  Many women progress through stage 1 naturally, but you may need help to continue making progress. If this happens, your health care provider may talk with you about: ? Rupturing your amniotic sac if it has not ruptured yet. ? Giving you medicine to help make your contractions stronger and more frequent.  Stage 1 ends when your cervix is completely dilated to 4 inches (10 cm) and completely effaced. This happens at the end of the transition phase. Stage 2  Once   your cervix is completely effaced and dilated to 4 inches (10 cm), you may start to feel an urge to push. It is common for the body to naturally take a rest before feeling the urge to push, especially if you received an epidural or certain other pain medicines. This rest period may last for up to 1-2 hours, depending on your unique labor experience.  During stage 2, contractions are generally less painful, because pushing helps relieve contraction pain. Instead of contraction pain, you may feel stretching and burning pain, especially when the widest part of your baby's head passes through the vaginal opening (crowning).  Your health care provider will closely monitor your pushing progress and your baby's progress through the vagina during stage 2.  Your  health care provider may massage the area of skin between your vaginal opening and anus (perineum) or apply warm compresses to your perineum. This helps it stretch as the baby's head starts to crown, which can help prevent perineal tearing. ? In some cases, an incision may be made in your perineum (episiotomy) to allow the baby to pass through the vaginal opening. An episiotomy helps to make the opening of the vagina larger to allow more room for the baby to fit through.  It is very important to breathe and focus so your health care provider can control the delivery of your baby's head. Your health care provider may have you decrease the intensity of your pushing, to help prevent perineal tearing.  After delivery of your baby's head, the shoulders and the rest of the body generally deliver very quickly and without difficulty.  Once your baby is delivered, the umbilical cord may be cut right away, or this may be delayed for 1-2 minutes, depending on your baby's health. This may vary among health care providers, hospitals, and birth centers.  If you and your baby are healthy enough, your baby may be placed on your chest or abdomen to help maintain the baby's temperature and to help you bond with each other. Some mothers and babies start breastfeeding at this time. Your health care team will dry your baby and help keep your baby warm during this time.  Your baby may need immediate care if he or she: ? Showed signs of distress during labor. ? Has a medical condition. ? Was born too early (prematurely). ? Had a bowel movement before birth (meconium). ? Shows signs of difficulty transitioning from being inside the uterus to being outside of the uterus. If you are planning to breastfeed, your health care team will help you begin a feeding. Stage 3  The third stage of labor starts immediately after the birth of your baby and ends after you deliver the placenta. The placenta is an organ that develops  during pregnancy to provide oxygen and nutrients to your baby in the womb.  Delivering the placenta may require some pushing, and you may have mild contractions. Breastfeeding can stimulate contractions to help you deliver the placenta.  After the placenta is delivered, your uterus should tighten (contract) and become firm. This helps to stop bleeding in your uterus. To help your uterus contract and to control bleeding, your health care provider may: ? Give you medicine by injection, through an IV tube, by mouth, or through your rectum (rectally). ? Massage your abdomen or perform a vaginal exam to remove any blood clots that are left in your uterus. ? Empty your bladder by placing a thin, flexible tube (catheter) into your bladder. ? Encourage   you to breastfeed your baby. After labor is over, you and your baby will be monitored closely to ensure that you are both healthy until you are ready to go home. Your health care team will teach you how to care for yourself and your baby. This information is not intended to replace advice given to you by your health care provider. Make sure you discuss any questions you have with your health care provider. Document Released: 11/04/2007 Document Revised: 08/15/2015 Document Reviewed: 02/09/2015 Elsevier Interactive Patient Education  2018 [employer]  

## 2017-08-30 NOTE — Progress Notes (Signed)
ROB-Reports pelvic pressure. Requests SVE, unchanged since previous visit. Anticipatory guidance regarding prenatal care. Encouraged home labor preparation techniques. Reviewed red flag symptoms and when to call. RTC x 1 week for ROB or sooner if needed.

## 2017-08-31 ENCOUNTER — Encounter: Payer: Self-pay | Admitting: Certified Nurse Midwife

## 2017-09-05 ENCOUNTER — Encounter: Payer: Self-pay | Admitting: Certified Nurse Midwife

## 2017-09-05 ENCOUNTER — Ambulatory Visit (INDEPENDENT_AMBULATORY_CARE_PROVIDER_SITE_OTHER): Payer: Medicaid Other | Admitting: Certified Nurse Midwife

## 2017-09-05 ENCOUNTER — Other Ambulatory Visit: Payer: Self-pay | Admitting: Certified Nurse Midwife

## 2017-09-05 VITALS — BP 111/76 | HR 84 | Wt 199.0 lb

## 2017-09-05 DIAGNOSIS — O9989 Other specified diseases and conditions complicating pregnancy, childbirth and the puerperium: Secondary | ICD-10-CM

## 2017-09-05 DIAGNOSIS — R399 Unspecified symptoms and signs involving the genitourinary system: Secondary | ICD-10-CM

## 2017-09-05 DIAGNOSIS — R102 Pelvic and perineal pain: Secondary | ICD-10-CM

## 2017-09-05 DIAGNOSIS — Z3493 Encounter for supervision of normal pregnancy, unspecified, third trimester: Secondary | ICD-10-CM

## 2017-09-05 NOTE — Patient Instructions (Signed)
Braxton Hicks Contractions °Contractions of the uterus can occur throughout pregnancy, but they are not always a sign that you are in labor. You may have practice contractions called Braxton Hicks contractions. These false labor contractions are sometimes confused with true labor. °What are Braxton Hicks contractions? °Braxton Hicks contractions are tightening movements that occur in the muscles of the uterus before labor. Unlike true labor contractions, these contractions do not result in opening (dilation) and thinning of the cervix. Toward the end of pregnancy (32-34 weeks), Braxton Hicks contractions can happen more often and may become stronger. These contractions are sometimes difficult to tell apart from true labor because they can be very uncomfortable. You should not feel embarrassed if you go to the hospital with false labor. °Sometimes, the only way to tell if you are in true labor is for your health care provider to look for changes in the cervix. The health care provider will do a physical exam and may monitor your contractions. If you are not in true labor, the exam should show that your cervix is not dilating and your water has not broken. °If there are other health problems associated with your pregnancy, it is completely safe for you to be sent home with false labor. You may continue to have Braxton Hicks contractions until you go into true labor. °How to tell the difference between true labor and false labor °True labor °· Contractions last 30-70 seconds. °· Contractions become very regular. °· Discomfort is usually felt in the top of the uterus, and it spreads to the lower abdomen and low back. °· Contractions do not go away with walking. °· Contractions usually become more intense and increase in frequency. °· The cervix dilates and gets thinner. °False labor °· Contractions are usually shorter and not as strong as true labor contractions. °· Contractions are usually irregular. °· Contractions  are often felt in the front of the lower abdomen and in the groin. °· Contractions may go away when you walk around or change positions while lying down. °· Contractions get weaker and are shorter-lasting as time goes on. °· The cervix usually does not dilate or become thin. °Follow these instructions at home: °· Take over-the-counter and prescription medicines only as told by your health care provider. °· Keep up with your usual exercises and follow other instructions from your health care provider. °· Eat and drink lightly if you think you are going into labor. °· If Braxton Hicks contractions are making you uncomfortable: °? Change your position from lying down or resting to walking, or change from walking to resting. °? Sit and rest in a tub of warm water. °? Drink enough fluid to keep your urine pale yellow. Dehydration may cause these contractions. °? Do slow and deep breathing several times an hour. °· Keep all follow-up prenatal visits as told by your health care provider. This is important. °Contact a health care provider if: °· You have a fever. °· You have continuous pain in your abdomen. °Get help right away if: °· Your contractions become stronger, more regular, and closer together. °· You have fluid leaking or gushing from your vagina. °· You pass blood-tinged mucus (bloody show). °· You have bleeding from your vagina. °· You have low back pain that you never had before. °· You feel your baby’s head pushing down and causing pelvic pressure. °· Your baby is not moving inside you as much as it used to. °Summary °· Contractions that occur before labor are called Braxton   Hicks contractions, false labor, or practice contractions. °· Braxton Hicks contractions are usually shorter, weaker, farther apart, and less regular than true labor contractions. True labor contractions usually become progressively stronger and regular and they become more frequent. °· Manage discomfort from Braxton Hicks contractions by  changing position, resting in a warm bath, drinking plenty of water, or practicing deep breathing. °This information is not intended to replace advice given to you by your health care provider. Make sure you discuss any questions you have with your health care provider. °Document Released: 06/10/2016 Document Revised: 06/10/2016 Document Reviewed: 06/10/2016 °Elsevier Interactive Patient Education © 2018 [employer] ° °

## 2017-09-05 NOTE — Progress Notes (Signed)
ROB, doing well. Complains of low pelvic pain and round ligament pain. Feels good movement. Encouraged use of belly band. Pt verbalizes understanding and state she has a band but it is uncomfortable to use. Reassurance given. SVE per pt request, no changes. Labor precautions reviewed. Follow up 1 wk.   Doreene BurkeAnnie Baylee Mccorkel, CNM

## 2017-09-05 NOTE — Progress Notes (Signed)
Pt states bruising on lower abdomen which is achy and sore.

## 2017-09-07 LAB — URINE CULTURE

## 2017-09-08 ENCOUNTER — Encounter: Payer: Self-pay | Admitting: Certified Nurse Midwife

## 2017-09-11 ENCOUNTER — Observation Stay
Admission: EM | Admit: 2017-09-11 | Discharge: 2017-09-11 | Disposition: A | Discharge status: Home / Self Care | Payer: Medicaid Other | Attending: Obstetrics and Gynecology | Admitting: Obstetrics and Gynecology

## 2017-09-11 ENCOUNTER — Other Ambulatory Visit: Payer: Self-pay

## 2017-09-11 DIAGNOSIS — O9989 Other specified diseases and conditions complicating pregnancy, childbirth and the puerperium: Principal | ICD-10-CM | POA: Insufficient documentation

## 2017-09-11 DIAGNOSIS — Z3490 Encounter for supervision of normal pregnancy, unspecified, unspecified trimester: Secondary | ICD-10-CM

## 2017-09-11 DIAGNOSIS — Z3A39 39 weeks gestation of pregnancy: Secondary | ICD-10-CM

## 2017-09-11 DIAGNOSIS — R102 Pelvic and perineal pain: Secondary | ICD-10-CM | POA: Insufficient documentation

## 2017-09-11 MED ORDER — ACETAMINOPHEN 500 MG PO TABS
1000.0000 mg | ORAL_TABLET | Freq: Four times a day (QID) | ORAL | Status: DC | PRN
  Administered 2017-09-11: 500 mg via ORAL
  Filled 2017-09-11: qty 2

## 2017-09-11 NOTE — OB Triage Note (Deleted)
   L&D OB Triage Note  SUBJECTIVE Angela Mcgrath is a 22 y.o. 43P1011 female at 250w2d, EDD Estimated Date of Delivery: 09/16/17 who presented to triage with complaints of pubic bone pain. She feels good fetal movement, denies LOF and vaginal bleeding.   OB History  Gravida Para Term Preterm AB Living  3 1 1  0 1 1  SAB TAB Ectopic Multiple Live Births  1 0 0 0 1    # Outcome Date GA Lbr Len/2nd Weight Sex Delivery Anes PTL Lv  3 Current           2 Term 07/15/12    F Vag-Forceps   LIV  1 SAB 07/2011        FD    Obstetric Comments  Vaginal delivery was with forceps    Medications Prior to Admission  Medication Sig Dispense Refill Last Dose  . Prenatal Vit-Fe Fumarate-FA (PRENATAL MULTIVITAMIN) TABS tablet Take 1 tablet by mouth daily at 12 noon.   09/10/2017 at Unknown time     OBJECTIVE  Nursing Evaluation:   BP 117/76 (BP Location: Left Arm)   Pulse 84   Resp 16   Ht 5' (1.524 m)   Wt 199 lb (90.3 kg)   LMP 12/10/2016 (Exact Date)   BMI 38.86 kg/m    Findings:  Reactive NST  NST was performed and has been reviewed by me.  NST INTERPRETATION: Category I  Mode: External Baseline Rate (A): 125 bpm Variability: Moderate Accelerations: 15 x 15 Decelerations: None     Contraction Frequency (min): 2  ASSESSMENT Impression:  1.  Pregnancy:  G3P1011 at 5950w2d , EDD Estimated Date of Delivery: 09/16/17 2.  NST:  Category I  PLAN 1. Reassurance given. Pain decreased with tylenol.  2. Discharge home with standard labor precautions given to return to L&D or call the office for problems. 3. Continue routine prenatal care.    Doreene BurkeAnnie Jalacia Mattila, CNM

## 2017-09-11 NOTE — OB Triage Note (Addendum)
Pt states she was getting into a bubble bath 8/2, slipped with legs going in opposite directions and felt a pulling in the lower abd/pubic area.  Pt rates pain 4/10 while not moving and 8/10 with movement/walking stating "it has just progressively gotten worse".  No c/o of LOF, DFM, or bleeding.    0400- Pt contracting every 2 min but not feeling any of them, no cervical change since last check in office.    0430- Pt given 500mg  of Tylenol per her request, refusing ordered dose of 1,00mg .  Pt given ice pack and discharged.

## 2017-09-12 ENCOUNTER — Other Ambulatory Visit: Payer: Self-pay | Admitting: Certified Nurse Midwife

## 2017-09-12 ENCOUNTER — Ambulatory Visit (INDEPENDENT_AMBULATORY_CARE_PROVIDER_SITE_OTHER): Payer: Medicaid Other | Admitting: Certified Nurse Midwife

## 2017-09-12 VITALS — BP 114/80 | HR 94 | Wt 204.6 lb

## 2017-09-12 DIAGNOSIS — O480 Post-term pregnancy: Secondary | ICD-10-CM

## 2017-09-12 DIAGNOSIS — Z3483 Encounter for supervision of other normal pregnancy, third trimester: Secondary | ICD-10-CM | POA: Diagnosis not present

## 2017-09-12 LAB — POCT URINALYSIS DIPSTICK
Bilirubin, UA: NEGATIVE
GLUCOSE UA: NEGATIVE
Ketones, UA: NEGATIVE
LEUKOCYTES UA: NEGATIVE
Nitrite, UA: NEGATIVE
PH UA: 7 (ref 5.0–8.0)
Protein, UA: POSITIVE — AB
RBC UA: NEGATIVE
Spec Grav, UA: 1.015 (ref 1.010–1.025)
Urobilinogen, UA: 1 E.U./dL

## 2017-09-12 NOTE — Progress Notes (Signed)
ROB-pt stated that she is doing well. Pt stated having pressure and pains off and on. No other complaints.

## 2017-09-12 NOTE — Progress Notes (Signed)
ROB-Reports irregular contractions. Discussed home treatment measures. Request SVE, no change from previous visit. Education regarding postdates care. Reviewed red flag symptoms and when to call.  RTC x 1 week for growth US, BPP and ROB or sooner if needed.

## 2017-09-12 NOTE — Patient Instructions (Addendum)
Vaginal Delivery  Vaginal delivery means that you will give birth by pushing your baby out of your birth canal (vagina). A team of health care providers will help you before, during, and after vaginal delivery. Birth experiences are unique for every woman and every pregnancy, and birth experiences vary depending on where you choose to give birth.  What should I do to prepare for my baby's birth?  Before your baby is born, it is important to talk with your health care provider about:   Your labor and delivery preferences. These may include:  ? Medicines that you may be given.  ? How you will manage your pain. This might include non-medical pain relief techniques or injectable pain relief such as epidural analgesia.  ? How you and your baby will be monitored during labor and delivery.  ? Who may be in the labor and delivery room with you.  ? Your feelings about surgical delivery of your baby (cesarean delivery, or C-section) if this becomes necessary.  ? Your feelings about receiving donated blood through an IV tube (blood transfusion) if this becomes necessary.   Whether you are able:  ? To take pictures or videos of the birth.  ? To eat during labor and delivery.  ? To move around, walk, or change positions during labor and delivery.   What to expect after your baby is born, such as:  ? Whether delayed umbilical cord clamping and cutting is offered.  ? Who will care for your baby right after birth.  ? Medicines or tests that may be recommended for your baby.  ? Whether breastfeeding is supported in your hospital or birth center.  ? How long you will be in the hospital or birth center.   How any medical conditions you have may affect your baby or your labor and delivery experience.    To prepare for your baby's birth, you should also:   Attend all of your health care visits before delivery (prenatal visits) as recommended by your health care provider. This is important.   Prepare your home for your baby's  arrival. Make sure that you have:  ? Diapers.  ? Baby clothing.  ? Feeding equipment.  ? Safe sleeping arrangements for you and your baby.   Install a car seat in your vehicle. Have your car seat checked by a certified car seat installer to make sure that it is installed safely.   Think about who will help you with your new baby at home for at least the first several weeks after delivery.    What can I expect when I arrive at the birth center or hospital?  Once you are in labor and have been admitted into the hospital or birth center, your health care provider may:   Review your pregnancy history and any concerns you have.   Insert an IV tube into one of your veins. This is used to give you fluids and medicines.   Check your blood pressure, pulse, temperature, and heart rate (vital signs).   Check whether your bag of water (amniotic sac) has broken (ruptured).   Talk with you about your birth plan and discuss pain control options.    Monitoring  Your health care provider may monitor your contractions (uterine monitoring) and your baby's heart rate (fetal monitoring). You may need to be monitored:   Often, but not continuously (intermittently).   All the time or for long periods at a time (continuously). Continuous monitoring may be needed if:  ?   You are taking certain medicines, such as medicine to relieve pain or make your contractions stronger.  ? You have pregnancy or labor complications.    Monitoring may be done by:   Placing a special stethoscope or a handheld monitoring device on your abdomen to check your baby's heartbeat, and feeling your abdomen for contractions. This method of monitoring does not continuously record your baby's heartbeat or your contractions.   Placing monitors on your abdomen (external monitors) to record your baby's heartbeat and the frequency and length of contractions. You may not have to wear external monitors all the time.   Placing monitors inside of your uterus  (internal monitors) to record your baby's heartbeat and the frequency, length, and strength of your contractions.  ? Your health care provider may use internal monitors if he or she needs more information about the strength of your contractions or your baby's heart rate.  ? Internal monitors are put in place by passing a thin, flexible wire through your vagina and into your uterus. Depending on the type of monitor, it may remain in your uterus or on your baby's head until birth.  ? Your health care provider will discuss the benefits and risks of internal monitoring with you and will ask for your permission before inserting the monitors.   Telemetry. This is a type of continuous monitoring that can be done with external or internal monitors. Instead of having to stay in bed, you are able to move around during telemetry. Ask your health care provider if telemetry is an option for you.    Physical exam  Your health care provider may perform a physical exam. This may include:   Checking whether your baby is positioned:  ? With the head toward your vagina (head-down). This is most common.  ? With the head toward the top of your uterus (head-up or breech). If your baby is in a breech position, your health care provider may try to turn your baby to a head-down position so you can deliver vaginally. If it does not seem that your baby can be born vaginally, your provider may recommend surgery to deliver your baby. In rare cases, you may be able to deliver vaginally if your baby is head-up (breech delivery).  ? Lying sideways (transverse). Babies that are lying sideways cannot be delivered vaginally.   Checking your cervix to determine:  ? Whether it is thinning out (effacing).  ? Whether it is opening up (dilating).  ? How low your baby has moved into your birth canal.    What are the three stages of labor and delivery?    Normal labor and delivery is divided into the following three stages:  Stage 1   Stage 1 is the  longest stage of labor, and it can last for hours or days. Stage 1 includes:  ? Early labor. This is when contractions may be irregular, or regular and mild. Generally, early labor contractions are more than 10 minutes apart.  ? Active labor. This is when contractions get longer, more regular, more frequent, and more intense.  ? The transition phase. This is when contractions happen very close together, are very intense, and may last longer than during any other part of labor.   Contractions generally feel mild, infrequent, and irregular at first. They get stronger, more frequent (about every 2-3 minutes), and more regular as you progress from early labor through active labor and transition.   Many women progress through stage 1 naturally, but   you may need help to continue making progress. If this happens, your health care provider may talk with you about:  ? Rupturing your amniotic sac if it has not ruptured yet.  ? Giving you medicine to help make your contractions stronger and more frequent.   Stage 1 ends when your cervix is completely dilated to 4 inches (10 cm) and completely effaced. This happens at the end of the transition phase.  Stage 2   Once your cervix is completely effaced and dilated to 4 inches (10 cm), you may start to feel an urge to push. It is common for the body to naturally take a rest before feeling the urge to push, especially if you received an epidural or certain other pain medicines. This rest period may last for up to 1-2 hours, depending on your unique labor experience.   During stage 2, contractions are generally less painful, because pushing helps relieve contraction pain. Instead of contraction pain, you may feel stretching and burning pain, especially when the widest part of your baby's head passes through the vaginal opening (crowning).   Your health care provider will closely monitor your pushing progress and your baby's progress through the vagina during stage 2.   Your  health care provider may massage the area of skin between your vaginal opening and anus (perineum) or apply warm compresses to your perineum. This helps it stretch as the baby's head starts to crown, which can help prevent perineal tearing.  ? In some cases, an incision may be made in your perineum (episiotomy) to allow the baby to pass through the vaginal opening. An episiotomy helps to make the opening of the vagina larger to allow more room for the baby to fit through.   It is very important to breathe and focus so your health care provider can control the delivery of your baby's head. Your health care provider may have you decrease the intensity of your pushing, to help prevent perineal tearing.   After delivery of your baby's head, the shoulders and the rest of the body generally deliver very quickly and without difficulty.   Once your baby is delivered, the umbilical cord may be cut right away, or this may be delayed for 1-2 minutes, depending on your baby's health. This may vary among health care providers, hospitals, and birth centers.   If you and your baby are healthy enough, your baby may be placed on your chest or abdomen to help maintain the baby's temperature and to help you bond with each other. Some mothers and babies start breastfeeding at this time. Your health care team will dry your baby and help keep your baby warm during this time.   Your baby may need immediate care if he or she:  ? Showed signs of distress during labor.  ? Has a medical condition.  ? Was born too early (prematurely).  ? Had a bowel movement before birth (meconium).  ? Shows signs of difficulty transitioning from being inside the uterus to being outside of the uterus.  If you are planning to breastfeed, your health care team will help you begin a feeding.  Stage 3   The third stage of labor starts immediately after the birth of your baby and ends after you deliver the placenta. The placenta is an organ that develops  during pregnancy to provide oxygen and nutrients to your baby in the womb.   Delivering the placenta may require some pushing, and you may have mild contractions. Breastfeeding   can stimulate contractions to help you deliver the placenta.   After the placenta is delivered, your uterus should tighten (contract) and become firm. This helps to stop bleeding in your uterus. To help your uterus contract and to control bleeding, your health care provider may:  ? Give you medicine by injection, through an IV tube, by mouth, or through your rectum (rectally).  ? Massage your abdomen or perform a vaginal exam to remove any blood clots that are left in your uterus.  ? Empty your bladder by placing a thin, flexible tube (catheter) into your bladder.  ? Encourage you to breastfeed your baby.  After labor is over, you and your baby will be monitored closely to ensure that you are both healthy until you are ready to go home. Your health care team will teach you how to care for yourself and your baby.  This information is not intended to replace advice given to you by your health care provider. Make sure you discuss any questions you have with your health care provider.  Document Released: 11/04/2007 Document Revised: 08/15/2015 Document Reviewed: 02/09/2015  Elsevier Interactive Patient Education  2018 [employer]  Fetal Movement Counts  Patient Name: ________________________________________________ Patient Due Date: ____________________  What is a fetal movement count?  A fetal movement count is the number of times that you feel your baby move during a certain amount of time. This may also be called a fetal kick count. A fetal movement count is recommended for every pregnant woman. You may be asked to start counting fetal movements as early as week 28 of your pregnancy.  Pay attention to when your baby is most active. You may notice your baby's sleep and wake cycles. You may also notice things that make your baby move more.  You should do a fetal movement count:   When your baby is normally most active.   At the same time each day.    A good time to count movements is while you are resting, after having something to eat and drink.  How do I count fetal movements?  1. Find a quiet, comfortable area. Sit, or lie down on your side.  2. Write down the date, the start time and stop time, and the number of movements that you felt between those two times. Take this information with you to your health care visits.  3. For 2 hours, count kicks, flutters, swishes, rolls, and jabs. You should feel at least 10 movements during 2 hours.  4. You may stop counting after you have felt 10 movements.  5. If you do not feel 10 movements in 2 hours, have something to eat and drink. Then, keep resting and counting for 1 hour. If you feel at least 4 movements during that hour, you may stop counting.  Contact a health care provider if:   You feel fewer than 4 movements in 2 hours.   Your baby is not moving like he or she usually does.  Date: ____________ Start time: ____________ Stop time: ____________ Movements: ____________  Date: ____________ Start time: ____________ Stop time: ____________ Movements: ____________  Date: ____________ Start time: ____________ Stop time: ____________ Movements: ____________  Date: ____________ Start time: ____________ Stop time: ____________ Movements: ____________  Date: ____________ Start time: ____________ Stop time: ____________ Movements: ____________  Date: ____________ Start time: ____________ Stop time: ____________ Movements: ____________  Date: ____________ Start time: ____________ Stop time: ____________ Movements: ____________  Date: ____________ Start time: ____________ Stop time: ____________ Movements: ____________    Date: ____________ Start time: ____________ Stop time: ____________ Movements: ____________  This information is not intended to replace advice given to you by your health care provider. Make  sure you discuss any questions you have with your health care provider.  Document Released: 02/24/2006 Document Revised: 09/24/2015 Document Reviewed: 03/06/2015  Elsevier Interactive Patient Education  2018 [employer]

## 2017-09-14 ENCOUNTER — Ambulatory Visit: Payer: Medicaid Other | Attending: Certified Nurse Midwife

## 2017-09-14 ENCOUNTER — Other Ambulatory Visit: Payer: Self-pay

## 2017-09-14 DIAGNOSIS — R293 Abnormal posture: Secondary | ICD-10-CM | POA: Diagnosis present

## 2017-09-14 DIAGNOSIS — R350 Frequency of micturition: Secondary | ICD-10-CM | POA: Insufficient documentation

## 2017-09-14 DIAGNOSIS — M791 Myalgia, unspecified site: Secondary | ICD-10-CM | POA: Insufficient documentation

## 2017-09-14 DIAGNOSIS — K5902 Outlet dysfunction constipation: Secondary | ICD-10-CM | POA: Diagnosis present

## 2017-09-14 DIAGNOSIS — M62838 Other muscle spasm: Secondary | ICD-10-CM | POA: Diagnosis not present

## 2017-09-14 NOTE — Therapy (Signed)
[facility] MAIN [facility] SERVICES [address] [place], [place], [address] Phone: [phone]   Fax:  [phone]  Physical Therapy Evaluation  Patient Details  Name: Angela Mcgrath MRN: [id number] Date of Birth: 1995-10-26 Referring Provider: Doreene Burke   Encounter Date: 09/14/2017  PT End of Session - 09/15/17 2154    Visit Number  1    Number of Visits  8    Date for PT Re-Evaluation  11/10/17    Authorization Type  Medicaid    Authorization - Visit Number  1    Authorization - Number of Visits  4    PT Start Time  1630    PT Stop Time  1730    PT Time Calculation (min)  60 min    Activity Tolerance  Patient tolerated treatment well;No increased pain    Behavior During Therapy  WFL for tasks assessed/performed       Past Medical History:  Diagnosis Date  . Anxiety   . Depression   . Family history of Downs syndrome    FOB-brother deceased  . IBS (irritable bowel syndrome)   . Increased BMI     Past Surgical History:  Procedure Laterality Date  . NO PAST SURGERIES    . none      There were no vitals filed for this visit.  Pelvic Floor Physical Therapy Evaluation and Assessment  SCREENING  Falls in last 6 mo: slipped and hit her foot on the tub. Went to the hospital and was told she strained a muscle between her legs.  Patient's communication preference:   Red Flags:  Have you had any night sweats? no Unexplained weight loss? no Saddle anesthesia? no Unexplained changes in bowel or bladder habits? no  SUBJECTIVE  Patient reports: Has had pain for 3 months, gradually increasing. Cannot walk, bend, lift anything without worsened pain. Can sleep for ~ 4-6 hours straight. Worse if no pillow between legs/ increased tension on back.  Precautions:  39 weeks, will breastfeed  Social/Family/Vocational History:   Not working  Recent Procedures/Tests/Findings: chiropractic daily since Monday including today (SIJ  dysfunction)  Obstetrical History: G3P1  One miscarriage early in pregnancy  Gynecological History: N/A  Urinary History: Frequent urination. Every ~ 30 min.   Gastrointestinal History: Constipation, had a BM about 1x/week straining. hemorrhoids.  Sexual activity/pain: none  Location of pain: pubic bone and groin Current pain:  7/10  Max pain:  10/10 Least pain:  4-5/10 Nature of pain: burning, sharp on occasion.  Patient Goals: Be able to walk and take care of herself and kids independently.   OBJECTIVE  Posture/Observations:  Sitting: anterior pelvic tilt Standing: Anterior pelvic tilt, genu valgus, flat feet, hyper lordosis.  Palpation/Segmental Motion/Joint TTP to B adductors, lumbar extensors and deep hip ERs   Special tests:  Deferred to following visit    Range of Motion/Flexibilty: Deferred to following visit  Spine: Hips:   Strength/MMT: Deferred to following visit  LE MMT  LE MMT Left Right  Hip flex:  (L2) /5 /5  Hip ext: /5 /5  Hip abd: /5 /5  Hip add: /5 /5  Hip IR /5 /5  Hip ER /5 /5     Abdominal: not assessed Palpation: Diastasis:  Pelvic Floor External Exam: Deferred to following visit Introitus Appears:  Skin integrity:  Palpation: Cough: Prolapse visible?: Scar mobility:  Internal Vaginal Exam: Deferred to following visit  Strength (PERF):  Symmetry: Palpation: Prolapse:   Total time:  60 min.       [facility] PT Assessment - 09/15/17 0001      Assessment   Medical Diagnosis  Pain of joint in pelvic region and hip affecting pregnancy    Referring Provider  Doreene Burke    Onset Date/Surgical Date  06/15/17    Next MD Visit  09/19/2017    Prior Therapy  none      Precautions   Precautions  --   [redacted] weeks pregnant     Restrictions   Weight Bearing Restrictions  No      Balance Screen   Has the patient fallen in the past 6 months  Yes    How many times?  1    Has the patient had a decrease in activity  level because of a fear of falling?   No    Is the patient reluctant to leave their home because of a fear of falling?   No      Home Environment   Living Environment  Private residence    Living Arrangements  Spouse/significant other;Parent    Available Help at Discharge  Family    Type of Home  Mobile home    Home Access  Stairs to enter    Entrance Stairs-Number of Steps  2    Entrance Stairs-Rails  Right;Left    Home Layout  One level                Objective measurements completed on examination: See above findings.              PT Education - 09/15/17 2152    Education Details  Educated on Trigger point release, Baby Bod Book for postpartum strengthening and advice, noted defecits and POC.    Person(s) Educated  Patient;Other (comment)   best friend   Methods  Explanation;Demonstration;Tactile cues;Verbal cues    Comprehension  Verbalized understanding;Returned demonstration;Verbal cues required;Tactile cues required       PT Short Term Goals - 09/15/17 2205      PT SHORT TERM GOAL #1   Title  Patient will describe pain no greater than 4/10 during standing and walking, bending and holding up to 10 lbs. to demonstrate improved functional ability.    Baseline  unable to bend to retrieve anything from the floor.    Time  4    Period  Weeks    Status  New    Target Date  10/13/17      PT SHORT TERM GOAL #2   Title  Patient will demonstrate a coordinated contraction, relaxation, and bulge of the pelvic floor muscles to demonstrate functional recruitment and motion and allow for further strengthening.    Baseline  Pt. has little control over PFM due to spasms.    Time  4    Period  Weeks    Status  New    Target Date  10/13/17      PT SHORT TERM GOAL #3   Title  Patient will demonstrate coordinated diaphragmatic breathing with pelvic tilts to demonstrate improved control of diaphragm and TA, to allow for further strengthening of core musculature and  decreased pelvic floor spasms for improved abilty to have a vaginal delivery.    Time  1    Period  Weeks    Status  New    Target Date  09/22/17        PT Long Term Goals - 09/15/17 2210      PT LONG TERM  GOAL #1   Title  Patient will report a reduction in pain to no greater than 2/10 over the prior week to demonstrate symptom improvement.    Baseline  9/10 at worst    Time  8    Period  Weeks    Status  New    Target Date  11/10/17      PT LONG TERM GOAL #2   Title  Patient will report having BM's at least every-other day with consistency between [facility] stool scale 3-5 over the prior week to demonstrate decreased constipation.    Baseline  has a BM ~ 1 time per week    Time  8    Period  Weeks    Status  New    Target Date  11/10/17      PT LONG TERM GOAL #3   Title  Patient will report urinating 6-8 times per day over the course of the prior week to demonstrate decreased frequency.    Baseline  every ~ 30 min.    Time  8    Period  Weeks    Status  New    Target Date  11/10/17             Plan - 09/15/17 2156    Clinical Impression Statement  Pt. is a 22 y/o female who is G2P1 and [redacted] weeks pregnant. She presents today with pubic symphysis pain that is preventing her form performing ADL's independently as well as chronic constipation and urinary frequency. Her PMH is significant for one prior vaginal delivery. Her clinical exam reveals poor posture, genu valgus, Significant B adductor spasm, anterior pelvic tilt, hip flexor tightness and spasms through low back and hips. She will benefit from skilled physical therapy before and after her imminent delivery to address noted defecits and to assess for and address any other potential causes for her pain and PFM dysfunction to allow her to resume independent ADL performance and allow her to care for her two children.     Clinical Presentation  Evolving    Clinical Decision Making  Moderate    Rehab Potential  Good    PT  Frequency  1x / week    PT Duration  8 weeks    PT Treatment/Interventions  ADLs/Self Care Home Management;Biofeedback;Electrical Stimulation;Gait training;Stair training;Functional mobility training;Therapeutic activities;Therapeutic exercise;Patient/family education;Neuromuscular re-education;Manual techniques;Scar mobilization;Taping;Dry needling    PT Next Visit Plan  Perform TP release to muscles surrounding B hips and low back. educate on TA recruitment and posture/postpartum precaustions.    Consulted and Agree with Plan of Care  Patient       Patient will benefit from skilled therapeutic intervention in order to improve the following deficits and impairments:  Abnormal gait, Increased fascial restricitons, Impaired sensation, Improper body mechanics, Pain, Hypermobility, Increased muscle spasms, Postural dysfunction, Decreased activity tolerance, Decreased endurance, Decreased range of motion, Decreased strength, Decreased balance, Difficulty walking, Increased edema, Impaired flexibility, Obesity  Visit Diagnosis: Other muscle spasm  Abnormal posture  Myalgia  Constipation due to outlet dysfunction  Urinary frequency     Problem List Patient Active Problem List   Diagnosis Date Noted  . Pregnancy 09/11/2017  . Labor and delivery, indication for care 07/14/2017  . History of forceps delivery in prior pregnancy, currently pregnant 06/02/2017  . Dysmenorrhea 07/16/2015  . Missed period 07/16/2015  . Vaginitis 10/03/2014   Cleophus MoltKeeli T. Gailes DPT, ATC Cleophus MoltKeeli T Gailes 09/15/2017, 10:18 PM  [facility] MAIN  [facility] SERVICES [address] [place], [place], [address] Phone: [phone]   Fax:  [phone]  Name: KAITLYND PHILLIPS MRN: [id number] Date of Birth: 1995-11-07

## 2017-09-19 ENCOUNTER — Other Ambulatory Visit: Payer: Self-pay

## 2017-09-19 ENCOUNTER — Ambulatory Visit (INDEPENDENT_AMBULATORY_CARE_PROVIDER_SITE_OTHER): Payer: Medicaid Other | Admitting: Certified Nurse Midwife

## 2017-09-19 ENCOUNTER — Observation Stay
Admission: EM | Admit: 2017-09-19 | Discharge: 2017-09-19 | Disposition: A | Discharge status: Home / Self Care | Payer: Medicaid Other | Attending: Certified Nurse Midwife | Admitting: Certified Nurse Midwife

## 2017-09-19 ENCOUNTER — Ambulatory Visit (INDEPENDENT_AMBULATORY_CARE_PROVIDER_SITE_OTHER): Payer: Medicaid Other

## 2017-09-19 VITALS — BP 118/79 | HR 109 | Wt 205.0 lb

## 2017-09-19 DIAGNOSIS — O480 Post-term pregnancy: Secondary | ICD-10-CM

## 2017-09-19 DIAGNOSIS — Z3A40 40 weeks gestation of pregnancy: Secondary | ICD-10-CM

## 2017-09-19 DIAGNOSIS — O471 False labor at or after 37 completed weeks of gestation: Principal | ICD-10-CM | POA: Insufficient documentation

## 2017-09-19 DIAGNOSIS — Z3483 Encounter for supervision of other normal pregnancy, third trimester: Secondary | ICD-10-CM

## 2017-09-19 LAB — ROM PLUS (ARMC ONLY): ROM PLUS: NEGATIVE

## 2017-09-19 NOTE — Discharge Summary (Signed)
  Obstetric Discharge Summary  Patient ID: Angela Mcgrath MRN: [id number] DOB/AGE: [birth year] 22 y.o.   Date of Admission: 09/19/2017  Date of Discharge: 09/19/17  Admitting Diagnosis: Observation at 7650w3d  Secondary Diagnosis: History of forcep assisted delivery   Antepartum Procedures: NST and ROM Plus  Brief Hospital Course   L&D OB Triage Note  Angela PiqueStormie D Polyak is a 22 y.o. 383P1011 female at 8850w3d, EDD Estimated Date of Delivery: 09/16/17 who presented to triage for complaints of leakage of brown fluid and contractions.  She was evaluated by the nurses with no significant findings for spontaneous rupture of membranes, fetal distress, or active labor. Vital signs stable. An NST was performed and has been reviewed by CNM.   NST INTERPRETATION:  Indications: rule out uterine contractions  Mode: External Baseline Rate (A): 125 bpm Variability: Moderate Accelerations: 15 x 15 Decelerations: None Contraction Frequency (min): 1-4  Impression: reactive   Plan: NST performed was reviewed and was found to be reactive. She was discharged home with bleeding/labor precautions.  Continue routine prenatal care. Follow up for induction of labor Wednesday morning as previously scheduled.    Discharge Instructions: Per After Visit Summary.  Activity: Also refer to After Visit Summary.  Diet: Regular  Medications:  Allergies as of 09/19/2017   No Known Allergies     Medication List    TAKE these medications   prenatal multivitamin Tabs tablet Take 1 tablet by mouth daily at 12 noon.      Discharged Condition: stable  Discharged to: home   Gunnar BullaJenkins Michelle Lawhorn, CNM [facility], [facility]

## 2017-09-19 NOTE — Progress Notes (Signed)
ROB, doing well. U/s today for postdates. BPP 8/8. Feels irregular contractions. SVE 2/50/-3. Induction scheduled 8/14 @ 0000. Agrees to plan.   Doreene BurkeAnnie Kasondra Mcgrath, CNM   ULTRASOUND REPORT  Location: [facility] Date of Service:  09/19/2017  Indications: BPP & Growth; Post-Dates Findings:  Singleton intrauterine pregnancy is visualized with FHR at 131 BPM. Biometrics give an (U/S) Gestational age of 24 4/7 weeks and an (U/S) EDD of 09/29/17; this correlates with the clinically established EDD of 09/16/17.  Fetal presentation is vertex.  EFW: 3492 grams (7lb 11oz).  49th percentile. Placenta: Anterior and grade 3. AFI: 11.1 cm.  BPP: 8/8 with good visualization of fetal movement, tone, breathing, and fluid.  Fetal stomach, kidneys, and bladder appear WNL.  Impression: 1. 38 4/7 week Viable Singleton Intrauterine pregnancy by U/S. 2. (U/S) EDD is consistent with Clinically established (LMP) EDD of 09/16/17. 3. EFW: 3492 grams (7lb 11oz).  49th percentile. 4. BPP: 8/8  Recommendations: 1.Clinical correlation with the patient's History and Physical Exam.   Kari BaarsJill Long, RDMS

## 2017-09-19 NOTE — Progress Notes (Signed)
Provided and reviewed AVS with patient and significant other. Reviewed reasons to come back to hospital-leaking of fluid, increased pain with contractions, vaginal bleeding, decreased fetal movement, etc. Pt and significant other verbalized understanding. Pt to return to hospital at midnight tomorrow (09/20/2017) night for IOL. Discharged to visitor entrance via wheelchair, significant other to drive home.

## 2017-09-19 NOTE — Patient Instructions (Signed)
Labor Induction Labor induction is when steps are taken to cause a pregnant woman to begin the labor process. Most women go into labor on their own between 37 weeks and 42 weeks of the pregnancy. When this does not happen or when there is a medical need, methods may be used to induce labor. Labor induction causes a pregnant woman's uterus to contract. It also causes the cervix to soften (ripen), open (dilate), and thin out (efface). Usually, labor is not induced before 39 weeks of the pregnancy unless there is a problem with the baby or mother. Before inducing labor, your health care provider will consider a number of factors, including the following:  The medical condition of you and the baby.  How many weeks along you are.  The status of the baby's lung maturity.  The condition of the cervix.  The position of the baby. What are the reasons for labor induction? Labor may be induced for the following reasons:  The health of the baby or mother is at risk.  The pregnancy is overdue by 1 week or more.  The water breaks but labor does not start on its own.  The mother has a health condition or serious illness, such as high blood pressure, infection, placental abruption, or diabetes.  The amniotic fluid amounts are low around the baby.  The baby is distressed. Convenience or wanting the baby to be born on a certain date is not a reason for inducing labor. What methods are used for labor induction? Several methods of labor induction may be used, such as:  Prostaglandin medicine. This medicine causes the cervix to dilate and ripen. The medicine will also start contractions. It can be taken by mouth or by inserting a suppository into the vagina.  Inserting a thin tube (catheter) with a balloon on the end into the vagina to dilate the cervix. Once inserted, the balloon is expanded with water, which causes the cervix to open.  Stripping the membranes. Your health care provider separates  amniotic sac tissue from the cervix, causing the cervix to be stretched and causing the release of a hormone called progesterone. This may cause the uterus to contract. It is often done during an office visit. You will be sent home to wait for the contractions to begin. You will then come in for an induction.  Breaking the water. Your health care provider makes a hole in the amniotic sac using a small instrument. Once the amniotic sac breaks, contractions should begin. This may still take hours to see an effect.  Medicine to trigger or strengthen contractions. This medicine is given through an IV access tube inserted into a vein in your arm. All of the methods of induction, besides stripping the membranes, will be done in the hospital. Induction is done in the hospital so that you and the baby can be carefully monitored. How long does it take for labor to be induced? Some inductions can take up to 2-3 days. Depending on the cervix, it usually takes less time. It takes longer when you are induced early in the pregnancy or if this is your first pregnancy. If a mother is still pregnant and the induction has been going on for 2-3 days, either the mother will be sent home or a cesarean delivery will be needed. What are the risks associated with labor induction? Some of the risks of induction include:  Changes in fetal heart rate, such as too high, too low, or erratic.  Fetal distress.    Chance of infection for the mother and baby.  Increased chance of having a cesarean delivery.  Breaking off (abruption) of the placenta from the uterus (rare).  Uterine rupture (very rare). When induction is needed for medical reasons, the benefits of induction may outweigh the risks. What are some reasons for not inducing labor? Labor induction should not be done if:  It is shown that your baby does not tolerate labor.  You have had previous surgeries on your uterus, such as a myomectomy or the removal of  fibroids.  Your placenta lies very low in the uterus and blocks the opening of the cervix (placenta previa).  Your baby is not in a head-down position.  The umbilical cord drops down into the birth canal in front of the baby. This could cut off the baby's blood and oxygen supply.  You have had a previous cesarean delivery.  There are unusual circumstances, such as the baby being extremely premature. This information is not intended to replace advice given to you by your health care provider. Make sure you discuss any questions you have with your health care provider. Document Released: 06/16/2006 Document Revised: 07/03/2015 Document Reviewed: 08/24/2012 Elsevier Interactive Patient Education  2017 Elsevier Inc.  

## 2017-09-19 NOTE — Progress Notes (Signed)
G3/P1 7375w3d here reporting while she was at chiropractor earlier (16:40) and noticed a gush of brown, mucous fluid. Also reports onset of contractions 15 minutes prior to arrival (1700) rated 5/10 to lower abdomen, unable to explain how far apart ctx have been. Positive fetal movement reported. Denies any other issues/questions/concerns. Monitors applied/assessing.

## 2017-09-19 NOTE — Progress Notes (Signed)
Pt is here for an ROB visit. Had U/S first.

## 2017-09-21 ENCOUNTER — Inpatient Hospital Stay: Payer: Medicaid Other | Admitting: Anesthesiology

## 2017-09-21 ENCOUNTER — Inpatient Hospital Stay
Admission: EM | Admit: 2017-09-21 | Discharge: 2017-09-22 | DRG: 807 | Disposition: A | Discharge status: Home / Self Care | Payer: Medicaid Other | Attending: Certified Nurse Midwife | Admitting: Certified Nurse Midwife

## 2017-09-21 ENCOUNTER — Other Ambulatory Visit: Payer: Self-pay

## 2017-09-21 ENCOUNTER — Encounter: Payer: Self-pay | Admitting: *Deleted

## 2017-09-21 DIAGNOSIS — Z3A40 40 weeks gestation of pregnancy: Secondary | ICD-10-CM

## 2017-09-21 DIAGNOSIS — O480 Post-term pregnancy: Principal | ICD-10-CM | POA: Diagnosis present

## 2017-09-21 DIAGNOSIS — O99214 Obesity complicating childbirth: Secondary | ICD-10-CM | POA: Diagnosis present

## 2017-09-21 DIAGNOSIS — Z87891 Personal history of nicotine dependence: Secondary | ICD-10-CM

## 2017-09-21 LAB — CBC
HCT: 36 % (ref 35.0–47.0)
Hemoglobin: 12.4 g/dL (ref 12.0–16.0)
MCH: 30.1 pg (ref 26.0–34.0)
MCHC: 34.5 g/dL (ref 32.0–36.0)
MCV: 87.2 fL (ref 80.0–100.0)
PLATELETS: 200 10*3/uL (ref 150–440)
RBC: 4.13 MIL/uL (ref 3.80–5.20)
RDW: 13.4 % (ref 11.5–14.5)
WBC: 10.6 10*3/uL (ref 3.6–11.0)

## 2017-09-21 LAB — TYPE AND SCREEN
ABO/RH(D): O POS
Antibody Screen: NEGATIVE

## 2017-09-21 MED ORDER — WITCH HAZEL-GLYCERIN EX PADS: 1.0000 "application " | MEDICATED_PAD | CUTANEOUS | Status: DC | PRN

## 2017-09-21 MED ORDER — ONDANSETRON HCL 4 MG/2ML IJ SOLN: 4.0000 mg | INTRAMUSCULAR | Status: DC | PRN

## 2017-09-21 MED ORDER — METHYLERGONOVINE MALEATE 0.2 MG PO TABS
0.2000 mg | ORAL_TABLET | ORAL | Status: DC | PRN
Start: 2017-09-21 — End: 2017-09-22

## 2017-09-21 MED ORDER — METHYLERGONOVINE MALEATE 0.2 MG/ML IJ SOLN: 0.2000 mg | INTRAMUSCULAR | Status: DC | PRN

## 2017-09-21 MED ORDER — COCONUT OIL OIL
1.0000 "application " | TOPICAL_OIL | Status: DC | PRN
  Administered 2017-09-21: 1 via TOPICAL
  Filled 2017-09-21: qty 120

## 2017-09-21 MED ORDER — SODIUM CHLORIDE 0.9% FLUSH: 3.0000 mL | Freq: Two times a day (BID) | INTRAVENOUS | Status: DC

## 2017-09-21 MED ORDER — SIMETHICONE 80 MG PO CHEW: 80.0000 mg | CHEWABLE_TABLET | ORAL | Status: DC | PRN

## 2017-09-21 MED ORDER — OXYTOCIN 40 UNITS IN LACTATED RINGERS INFUSION - SIMPLE MED
INTRAVENOUS | Status: AC
  Administered 2017-09-21: 500 mL via INTRAVENOUS
  Filled 2017-09-21: qty 1000

## 2017-09-21 MED ORDER — SODIUM CHLORIDE 0.9 % IV SOLN
INTRAVENOUS | Status: DC | PRN
  Administered 2017-09-21 (×2): 5 mL via EPIDURAL

## 2017-09-21 MED ORDER — MISOPROSTOL 25 MCG QUARTER TABLET
50.0000 ug | ORAL_TABLET | ORAL | Status: DC | PRN
  Administered 2017-09-21: 50 ug via VAGINAL
  Filled 2017-09-21: qty 1

## 2017-09-21 MED ORDER — BUTORPHANOL TARTRATE 1 MG/ML IJ SOLN
1.0000 mg | INTRAMUSCULAR | Status: DC | PRN
  Filled 2017-09-21: qty 1

## 2017-09-21 MED ORDER — DIPHENHYDRAMINE HCL 25 MG PO CAPS: 25.0000 mg | ORAL_CAPSULE | Freq: Four times a day (QID) | ORAL | Status: DC | PRN

## 2017-09-21 MED ORDER — LIDOCAINE HCL (PF) 1 % IJ SOLN
INTRAMUSCULAR | Status: AC
  Filled 2017-09-21: qty 30

## 2017-09-21 MED ORDER — SOD CITRATE-CITRIC ACID 500-334 MG/5ML PO SOLN
30.0000 mL | ORAL | Status: DC | PRN
Start: 2017-09-21 — End: 2017-09-21

## 2017-09-21 MED ORDER — LACTATED RINGERS IV SOLN
500.0000 mL | Freq: Once | INTRAVENOUS | Status: AC
  Administered 2017-09-21: 500 mL via INTRAVENOUS

## 2017-09-21 MED ORDER — OXYTOCIN BOLUS FROM INFUSION
500.0000 mL | Freq: Once | INTRAVENOUS | Status: AC
  Administered 2017-09-21: 500 mL via INTRAVENOUS

## 2017-09-21 MED ORDER — OXYTOCIN 10 UNIT/ML IJ SOLN: 10.0000 [IU] | Freq: Once | INTRAMUSCULAR | Status: DC

## 2017-09-21 MED ORDER — ACETAMINOPHEN 325 MG PO TABS: 650.0000 mg | ORAL_TABLET | ORAL | Status: DC | PRN

## 2017-09-21 MED ORDER — LACTATED RINGERS IV SOLN
INTRAVENOUS | Status: DC
  Administered 2017-09-21: 01:00:00 via INTRAVENOUS

## 2017-09-21 MED ORDER — FENTANYL 2.5 MCG/ML W/ROPIVACAINE 0.15% IN NS 100 ML EPIDURAL (ARMC)
12.0000 mL/h | EPIDURAL | Status: DC
  Administered 2017-09-21: 12 mL/h via EPIDURAL

## 2017-09-21 MED ORDER — OXYTOCIN 10 UNIT/ML IJ SOLN
INTRAMUSCULAR | Status: AC
  Filled 2017-09-21: qty 2

## 2017-09-21 MED ORDER — DIPHENHYDRAMINE HCL 50 MG/ML IJ SOLN: 12.5000 mg | INTRAMUSCULAR | Status: DC | PRN

## 2017-09-21 MED ORDER — PRENATAL MULTIVITAMIN CH
1.0000 | ORAL_TABLET | Freq: Every day | ORAL | Status: DC
  Administered 2017-09-21 – 2017-09-22 (×2): 1 via ORAL
  Filled 2017-09-21 (×2): qty 1

## 2017-09-21 MED ORDER — AMMONIA AROMATIC IN INHA
RESPIRATORY_TRACT | Status: AC
  Filled 2017-09-21: qty 10

## 2017-09-21 MED ORDER — PHENYLEPHRINE 40 MCG/ML (10ML) SYRINGE FOR IV PUSH (FOR BLOOD PRESSURE SUPPORT)
80.0000 ug | PREFILLED_SYRINGE | INTRAVENOUS | Status: DC | PRN
  Filled 2017-09-21: qty 5

## 2017-09-21 MED ORDER — IBUPROFEN 600 MG PO TABS
600.0000 mg | ORAL_TABLET | Freq: Four times a day (QID) | ORAL | Status: DC
  Administered 2017-09-21 – 2017-09-22 (×5): 600 mg via ORAL
  Filled 2017-09-21 (×5): qty 1

## 2017-09-21 MED ORDER — LACTATED RINGERS IV SOLN: 500.0000 mL | INTRAVENOUS | Status: DC | PRN

## 2017-09-21 MED ORDER — SODIUM CHLORIDE 0.9 % IV SOLN: 250.0000 mL | INTRAVENOUS | Status: DC | PRN

## 2017-09-21 MED ORDER — EPHEDRINE 5 MG/ML INJ
10.0000 mg | INTRAVENOUS | Status: DC | PRN
  Filled 2017-09-21: qty 2

## 2017-09-21 MED ORDER — LIDOCAINE HCL (PF) 1 % IJ SOLN: 30.0000 mL | INTRAMUSCULAR | Status: DC | PRN

## 2017-09-21 MED ORDER — ONDANSETRON HCL 4 MG/2ML IJ SOLN: 4.0000 mg | Freq: Four times a day (QID) | INTRAMUSCULAR | Status: DC | PRN

## 2017-09-21 MED ORDER — OXYTOCIN 40 UNITS IN LACTATED RINGERS INFUSION - SIMPLE MED
INTRAVENOUS | Status: AC
  Filled 2017-09-21: qty 1000

## 2017-09-21 MED ORDER — ONDANSETRON HCL 4 MG PO TABS: 4.0000 mg | ORAL_TABLET | ORAL | Status: DC | PRN

## 2017-09-21 MED ORDER — DIBUCAINE 1 % RE OINT: 1.0000 "application " | TOPICAL_OINTMENT | RECTAL | Status: DC | PRN

## 2017-09-21 MED ORDER — SODIUM CHLORIDE 0.9% FLUSH: 3.0000 mL | INTRAVENOUS | Status: DC | PRN

## 2017-09-21 MED ORDER — TERBUTALINE SULFATE 1 MG/ML IJ SOLN
0.2500 mg | Freq: Once | INTRAMUSCULAR | Status: DC | PRN
  Filled 2017-09-21: qty 1

## 2017-09-21 MED ORDER — LIDOCAINE-EPINEPHRINE (PF) 1.5 %-1:200000 IJ SOLN
INTRAMUSCULAR | Status: DC | PRN
  Administered 2017-09-21: 3 mL via EPIDURAL

## 2017-09-21 MED ORDER — SENNOSIDES-DOCUSATE SODIUM 8.6-50 MG PO TABS
2.0000 | ORAL_TABLET | ORAL | Status: DC
  Administered 2017-09-22: 2 via ORAL
  Filled 2017-09-21: qty 2

## 2017-09-21 MED ORDER — FENTANYL 2.5 MCG/ML W/ROPIVACAINE 0.15% IN NS 100 ML EPIDURAL (ARMC)
EPIDURAL | Status: AC
  Filled 2017-09-21: qty 100

## 2017-09-21 MED ORDER — LIDOCAINE HCL (PF) 1 % IJ SOLN
INTRAMUSCULAR | Status: DC | PRN
  Administered 2017-09-21: 1 mL via INTRADERMAL

## 2017-09-21 MED ORDER — MISOPROSTOL 200 MCG PO TABS
ORAL_TABLET | ORAL | Status: AC
  Filled 2017-09-21: qty 4

## 2017-09-21 MED ORDER — SODIUM CHLORIDE FLUSH 0.9 % IV SOLN
INTRAVENOUS | Status: AC
  Filled 2017-09-21: qty 10

## 2017-09-21 MED ORDER — BENZOCAINE-MENTHOL 20-0.5 % EX AERO
1.0000 | INHALATION_SPRAY | CUTANEOUS | Status: DC | PRN
Start: 2017-09-21 — End: 2017-09-22
  Administered 2017-09-21: 1 via TOPICAL
  Filled 2017-09-21: qty 56

## 2017-09-21 NOTE — Anesthesia Preprocedure Evaluation (Signed)
Anesthesia Evaluation  Patient identified by MRN, date of birth, ID band Patient awake    Reviewed: Allergy & Precautions, H&P , NPO status , Patient's Chart, lab work & pertinent test results  History of Anesthesia Complications Negative for: history of anesthetic complications  Airway Mallampati: III  TM Distance: >3 FB Neck ROM: full    Dental  (+) Chipped   Pulmonary former smoker,           Cardiovascular Exercise Tolerance: Good (-) hypertensionnegative cardio ROS       Neuro/Psych PSYCHIATRIC DISORDERS Anxiety Depression    GI/Hepatic negative GI ROS,   Endo/Other  Morbid obesity  Renal/GU   negative genitourinary   Musculoskeletal   Abdominal   Peds  Hematology negative hematology ROS (+)   Anesthesia Other Findings Past Medical History: No date: Anxiety No date: Depression No date: Family history of Downs syndrome     Comment:  FOB-brother deceased No date: IBS (irritable bowel syndrome) No date: Increased BMI  Past Surgical History: No date: NO PAST SURGERIES No date: none  BMI    Body Mass Index:  40.04 kg/m      Reproductive/Obstetrics (+) Pregnancy                             Anesthesia Physical Anesthesia Plan  ASA: III  Anesthesia Plan: Epidural   Post-op Pain Management:    Induction:   PONV Risk Score and Plan:   Airway Management Planned:   Additional Equipment:   Intra-op Plan:   Post-operative Plan:   Informed Consent: I have reviewed the patients History and Physical, chart, labs and discussed the procedure including the risks, benefits and alternatives for the proposed anesthesia with the patient or authorized representative who has indicated his/her understanding and acceptance.     Plan Discussed with: Anesthesiologist  Anesthesia Plan Comments:         Anesthesia Quick Evaluation

## 2017-09-21 NOTE — Anesthesia Procedure Notes (Signed)
Epidural Patient location during procedure: OB Start time: 09/21/2017 6:29 AM End time: 09/21/2017 6:36 AM  Staffing Anesthesiologist: Sheyann Sulton, Cleda MccreedyJoseph K, MD Performed: anesthesiologist   Preanesthetic Checklist Completed: patient identified, site marked, surgical consent, pre-op evaluation, timeout performed, IV checked, risks and benefits discussed and monitors and equipment checked  Epidural Patient position: sitting Prep: ChloraPrep Patient monitoring: heart rate, continuous pulse ox and blood pressure Approach: midline Location: L3-L4 Injection technique: LOR saline  Needle:  Needle type: Tuohy  Needle gauge: 17 G Needle length: 9 cm and 9 Needle insertion depth: 6 cm Catheter type: closed end flexible Catheter size: 19 Gauge Catheter at skin depth: 11 cm Test dose: negative and 1.5% lidocaine with Epi 1:200 K  Assessment Sensory level: T10 Events: blood not aspirated, injection not painful, no injection resistance, negative IV test and no paresthesia  Additional Notes 1 attempt Pt. Evaluated and documentation done after procedure finished. Patient identified. Risks/Benefits/Options discussed with patient including but not limited to bleeding, infection, nerve damage, paralysis, failed block, incomplete pain control, headache, blood pressure changes, nausea, vomiting, reactions to medication both or allergic, itching and postpartum back pain. Confirmed with bedside nurse the patient's most recent platelet count. Confirmed with patient that they are not currently taking any anticoagulation, have any bleeding history or any family history of bleeding disorders. Patient expressed understanding and wished to proceed. All questions were answered. Sterile technique was used throughout the entire procedure. Please see nursing notes for vital signs. Test dose was given through epidural catheter and negative prior to continuing to dose epidural or start infusion. Warning signs of  high block given to the patient including shortness of breath, tingling/numbness in hands, complete motor block, or any concerning symptoms with instructions to call for help. Patient was given instructions on fall risk and not to get out of bed. All questions and concerns addressed with instructions to call with any issues or inadequate analgesia.   Patient tolerated the insertion well without immediate complications.Reason for block:procedure for pain

## 2017-09-21 NOTE — Progress Notes (Signed)
Spoke with Angela RosenthalM. Lawhorn, CNM. Pt may come off the EFM to get in the shower. Pt can be off monitor for 1 hour at a time. Get fetal heart tones between each hour.

## 2017-09-21 NOTE — OB Triage Note (Signed)
Recvd pt from ED for IOL. Feeling baby move well. Denies LOF or vaginal bleeding.

## 2017-09-21 NOTE — H&P (Signed)
Obstetric History and Physical  Raihana Dub AmisD Rupe is a 22 y.o. G3P1011 with IUP at 3767w5d presenting for induction of labor.   Patient states she has been having  regular, every one (1) to three (3)  minutes contractions, minimal vaginal bleeding, ruptured membranes, with active fetal movement. Reports pelvic pressure.   Denies difficulty breathing or respiratory distress, chest pain, abdominal pain, dysuria, and leg pain or swelling.   Prenatal Course  Source of Care: [facility]-initial visit at 9 weeks, total visits: 17  Pregnancy complications or risks: History forceps assisted birth  Prenatal labs and studies:  ABO, Rh: --/--/O POS (08/14 0030)  Antibody: NEG (08/14 0030)  Rubella: 1.82 (01/04 1457)  Varicella: 995 (01/04 1457)  RPR: Non Reactive (05/23 1136)   HBsAg: Negative (01/04 1457)   HIV: Non Reactive (01/04 1457)   VWU:JWJXBJYNGBS:Negative (07/15 1554)  1 hr Glucola: 81 (05/23 1136)  Genetic screening: Declined  Anatomy US: Complete, normal (04/05 1341)  Past Medical History:  Diagnosis Date  . Anxiety   . Depression   . Family history of Downs syndrome    FOB-brother deceased  . IBS (irritable bowel syndrome)   . Increased BMI     Past Surgical History:  Procedure Laterality Date  . NO PAST SURGERIES    . none      OB History  Gravida Para Term Preterm AB Living  3 1 1   1 1   SAB TAB Ectopic Multiple Live Births  1       1    # Outcome Date GA Lbr Len/2nd Weight Sex Delivery Anes PTL Lv  3 Current           2 Term 07/15/12    F Vag-Forceps   LIV  1 SAB 07/2011        FD    Obstetric Comments  Vaginal delivery was with forceps    Social History   Socioeconomic History  . Marital status: Single    Spouse name: Not on file  . Number of children: Not on file  . Years of education: Not on file  . Highest education level: Not on file  Occupational History  . Not on file  Social Needs  . Financial resource strain: Not on file  . Food insecurity:   Worry: Not on file    Inability: Not on file  . Transportation needs:    Medical: Not on file    Non-medical: Not on file  Tobacco Use  . Smoking status: Former Games developermoker  . Smokeless tobacco: Never Used  Substance and Sexual Activity  . Alcohol use: No    Alcohol/week: 0.0 standard drinks  . Drug use: No  . Sexual activity: Yes    Partners: Male    Birth control/protection: None  Lifestyle  . Physical activity:    Days per week: Not on file    Minutes per session: Not on file  . Stress: Not on file  Relationships  . Social connections:    Talks on phone: Not on file    Gets together: Not on file    Attends religious service: Not on file    Active member of club or organization: Not on file    Attends meetings of clubs or organizations: Not on file    Relationship status: Not on file  Other Topics Concern  . Not on file  Social History Narrative  . Not on file    Family History  Problem Relation Age of Onset  .  Heart attack Mother        1140's  . Diabetes Mother   . COPD Father   . Asthma Father   . Diabetes Maternal Grandmother   . Heart disease Maternal Grandmother   . Diabetes Maternal Grandfather   . Colon cancer Neg Hx   . Ovarian cancer Neg Hx   . Breast cancer Neg Hx     Medications Prior to Admission  Medication Sig Dispense Refill Last Dose  . acetaminophen (TYLENOL) 650 MG CR tablet Take 650 mg by mouth every 8 (eight) hours as needed for pain.   Past Week at Unknown time  . Prenatal Vit-Fe Fumarate-FA (PRENATAL MULTIVITAMIN) TABS tablet Take 1 tablet by mouth daily at 12 noon.   09/20/2017 at Unknown time    No Known Allergies  Review of Systems: Negative except for what is mentioned in HPI.  Physical Exam:  Temp:  [98.7 F (37.1 C)] 98.7 F (37.1 C) (08/14 0028) Pulse Rate:  [100] 100 (08/14 0028) Resp:  [16] 16 (08/14 0028) BP: (121)/(74) 121/74 (08/14 0028) Weight:  [93 kg] 93 kg (08/14 0028)  GENERAL: Well-developed, well-nourished female  in no acute distress.   LUNGS: Clear to auscultation bilaterally.   HEART: Regular rate and rhythm.  ABDOMEN: Soft, nontender, nondistended, gravid.  EXTREMITIES: Nontender, no edema, 2+ distal pulses.  Cervical Exam: Dilation: 10 Dilation Complete Date: 09/21/17 Dilation Complete Time: 0703 Effacement (%): 100 Station: Plus 1 Presentation: Vertex Exam by:: MBS  FHT:  Baseline rate 115 bpm   Variability moderate  Accelerations present   Decelerations variable, late and prolonged  Contractions: Every one (1) to three (3) minutes, soft resting tone   Pertinent Labs/Studies:   Results for orders placed or performed during the hospital encounter of 09/21/17 (from the past 24 hour(s))  CBC     Status: None   Collection Time: 09/21/17 12:30 AM  Result Value Ref Range   WBC 10.6 3.6 - 11.0 K/uL   RBC 4.13 3.80 - 5.20 MIL/uL   Hemoglobin 12.4 12.0 - 16.0 g/dL   HCT 16.136.0 09.635.0 - 04.547.0 %   MCV 87.2 80.0 - 100.0 fL   MCH 30.1 26.0 - 34.0 pg   MCHC 34.5 32.0 - 36.0 g/dL   RDW 40.913.4 81.111.5 - 91.414.5 %   Platelets 200 150 - 440 K/uL  Type and screen     Status: None   Collection Time: 09/21/17 12:30 AM  Result Value Ref Range   ABO/RH(D) O POS    Antibody Screen NEG    Sample Expiration      09/24/2017 Performed at [facility], [address] [place], [place] [id number]     Assessment :  Davy PiqueStormie D Sholl is a 22 y.o. G3P1011 at 8241w5d being admitted for induction of labor, Rh positive, GBS negative, plans breastfeeding  FHR Category II  Plan:  Admit to birthing suites, see orders.   Induction/Augmentation as needed, per protocol.  Delivery plan: Hopeful for vaginal delivery.  Dr Valentino Saxonherry notified of impending birth and possibility of vacuum assisted birth.    Gunnar BullaJenkins Michelle Amaria Mundorf, CNM [facility], [facility]

## 2017-09-21 NOTE — Lactation Note (Signed)
This note was copied from a baby's chart. Lactation Consultation Note  Patient Name: Girl Elease EtienneStormie Cai WUJWJ'XToday's Date: 09/21/2017 Reason for consult: Initial assessment   Maternal Data    Feeding Feeding Type: Breast Fed Length of feed: 15 min  LATCH Score Latch: Repeated attempts needed to sustain latch, nipple held in mouth throughout feeding, stimulation needed to elicit sucking reflex.  Audible Swallowing: Spontaneous and intermittent  Type of Nipple: Everted at rest and after stimulation  Comfort (Breast/Nipple): Soft / non-tender  Hold (Positioning): Assistance needed to correctly position infant at breast and maintain latch.  LATCH Score: 8  Interventions Interventions: Breast feeding basics reviewed;Assisted with latch;Skin to skin;Hand express;Adjust position;Support pillows;Position options  Lactation Tools Discussed/Used     Consult Status  LC to room to assist with breastfeeding. Infant was initially sleepy but was undressed and placed on mom's chest to latch. Infant was able to latch after sandwiching the breast in the cradle position. LC reviewed the basics of breastfeeding, feeding cues, what to expect in the first 24 hours as well as cluster-feeding and hand expression.     Arlyss Gandylicia Adrea Sherpa 09/21/2017, 1:20 PM

## 2017-09-22 LAB — CBC
HCT: 34.7 % — ABNORMAL LOW (ref 35.0–47.0)
Hemoglobin: 11.8 g/dL — ABNORMAL LOW (ref 12.0–16.0)
MCH: 30.2 pg (ref 26.0–34.0)
MCHC: 34.1 g/dL (ref 32.0–36.0)
MCV: 88.5 fL (ref 80.0–100.0)
PLATELETS: 181 10*3/uL (ref 150–440)
RBC: 3.92 MIL/uL (ref 3.80–5.20)
RDW: 14.2 % (ref 11.5–14.5)
WBC: 9.9 10*3/uL (ref 3.6–11.0)

## 2017-09-22 MED ORDER — DOCUSATE SODIUM 100 MG PO CAPS: 100.0000 mg | ORAL_CAPSULE | Freq: Every day | ORAL | 2 refills | Status: DC | PRN

## 2017-09-22 MED ORDER — NORETHINDRONE 0.35 MG PO TABS: 1.0000 | ORAL_TABLET | Freq: Every day | ORAL | 11 refills | Status: DC

## 2017-09-22 MED ORDER — IBUPROFEN 600 MG PO TABS: 600.0000 mg | ORAL_TABLET | Freq: Four times a day (QID) | ORAL | 0 refills | Status: DC

## 2017-09-22 NOTE — Anesthesia Postprocedure Evaluation (Signed)
Anesthesia Post Note  Patient: Angela Mcgrath  Procedure(s) Performed: AN AD HOC LABOR EPIDURAL  Patient location during evaluation: Mother Baby Anesthesia Type: Epidural Level of consciousness: awake, awake and alert and oriented Pain management: pain level controlled Vital Signs Assessment: post-procedure vital signs reviewed and stable Respiratory status: spontaneous breathing, nonlabored ventilation and respiratory function stable Cardiovascular status: stable Anesthetic complications: no     Last Vitals:  Vitals:   09/22/17 0017 09/22/17 0351  BP: 110/63 114/65  Pulse: 81 70  Resp: 20 20  Temp:  36.7 C  SpO2: 100% 98%    Last Pain:  Vitals:   09/22/17 0421  TempSrc:   PainSc: Asleep                 Casey Burkitthuy Yandriel Boening

## 2017-09-22 NOTE — Final Progress Note (Signed)
Discharge Day SOAP Note:  Progress Note - Vaginal Delivery  Angela Mcgrath is a 22 y.o. [id number] now PP day 1 s/p Vaginal, Spontaneous . Delivery was uncomplicated  Subjective  The patient has the following complaints: has no unusual complaints  Pain is controlled with current medications.   Patient is urinating without difficulty.  She is ambulating well.     Objective  Vital signs: BP 107/61 (BP Location: Right Arm)   Pulse 63   Temp 98.1 F (36.7 C) (Oral)   Resp 20   Ht 5' (1.524 m)   Wt 93 kg   LMP 12/10/2016 (Exact Date)   SpO2 100%   Breastfeeding? Unknown   BMI 40.04 kg/m   Physical Exam: Gen: NAD Lungs clear bilaterally Heart: RRR Bowel sounds present, passing gas Fundus Fundal Tone: Firm @ U  Lochia Amount: Scant  Perineum Appearance: Intact, Approximated     Data Review Labs: CBC Latest Ref Rng & Units 09/22/2017 09/21/2017 06/30/2017  WBC 3.6 - 11.0 K/uL 9.9 10.6 9.0  Hemoglobin 12.0 - 16.0 g/dL 11.8(L) 12.4 11.5  Hematocrit 35.0 - 47.0 % 34.7(L) 36.0 35.0  Platelets 150 - 440 K/uL 181 200 213   O POS  Assessment/Plan  Active Problems:   Labor and delivery, indication for care    Plan for discharge today.   Discharge Instructions: Per After Visit Summary. Activity: Advance as tolerated. Pelvic rest for 6 weeks.  Also refer to After Visit Summary Diet: Regular Medications: Allergies as of 09/22/2017   No Known Allergies     Medication List    TAKE these medications   acetaminophen 650 MG CR tablet Commonly known as:  TYLENOL Take 650 mg by mouth every 8 (eight) hours as needed for pain.   docusate sodium 100 MG capsule Commonly known as:  COLACE Take 1 capsule (100 mg total) by mouth daily as needed.   ibuprofen 600 MG tablet Commonly known as:  ADVIL,MOTRIN Take 1 tablet (600 mg total) by mouth every 6 (six) hours.   norethindrone 0.35 MG tablet Commonly known as:  MICRONOR,CAMILA,ERRIN Take 1 tablet (0.35 mg  total) by mouth daily.   prenatal multivitamin Tabs tablet Take 1 tablet by mouth daily at 12 noon.      Outpatient follow up: [facility] with [name], CNM @ 6 wks PP Postpartum contraception: Progestin only pill to start @ 4wks postpartum   Discharged Condition: good  Discharged to: home  Newborn Data: Disposition:home with mother  Apgars: APGAR (1 MIN): 8   APGAR (5 MINS): 9   APGAR (10 MINS):    Baby Feeding: Breast  Doreene Burkennie Faydra Korman, CNM 09/22/2017 11:07 AM

## 2017-09-22 NOTE — Lactation Note (Signed)
This note was copied from a baby's chart. Lactation Consultation Note  Patient Name: Angela Mcgrath ZOXWR'UToday's Date: 09/22/2017     Maternal Data Formula Feeding for Exclusion: No Has patient been taught Hand Expression?: Yes Does the patient have breastfeeding experience prior to this delivery?: No  Feeding Feeding Type: Breast Fed  LATCH Score                   Interventions    Lactation Tools Discussed/Used     Consult Status  LC to room to check status of breastfeeding. Infant is currently in the nursery. Mother states that infant is breastfeeding well and she is not experiencing any pain while breastfeeding. LC gave mother information on breastfeeding support group and lactation outpatient clinic available for her if needed after her discharge. Mother denies any questions or concerns at this time.    [name] 09/22/2017, 11:36 AM

## 2017-09-22 NOTE — Progress Notes (Signed)
Pt states would like to wait on father of the baby for D/C instructions.

## 2017-09-22 NOTE — Progress Notes (Signed)
D/C home to car via auxiliary in wheelchair.  

## 2017-09-22 NOTE — Progress Notes (Signed)
Pt watching period of purple cry.  

## 2017-09-22 NOTE — Progress Notes (Signed)
D/C instructions provided, pt states understanding, aware of follow up appt.      

## 2017-09-22 NOTE — Discharge Summary (Signed)
                            Discharge Summary  Date of Admission: 09/21/2017  Date of Discharge: 09/22/2017  Admitting Diagnosis: Induction of labor at 4165w5d postdates   Mode of Delivery: normal spontaneous vaginal delivery                 Discharge Diagnosis: No other diagnosis   Intrapartum Procedures: epidural   Post partum procedures: none  Complications:  Bilateral labial lacerations repaired with 3-0 vicryl rapide under local and epidural anesthesia                      Discharge Day SOAP Note:  Progress Note - Vaginal Delivery  Angela Mcgrath is a 22 y.o. [id number] now PP day 1 s/p Vaginal, Spontaneous . Delivery was uncomplicated  Subjective  The patient has the following complaints: has no unusual complaints  Pain is controlled with current medications.   Patient is urinating without difficulty.  She is ambulating well.     Objective  Vital signs: BP 107/61 (BP Location: Right Arm)   Pulse 63   Temp 98.1 F (36.7 C) (Oral)   Resp 20   Ht 5' (1.524 m)   Wt 93 kg   LMP 12/10/2016 (Exact Date)   SpO2 100%   Breastfeeding? Unknown   BMI 40.04 kg/m   Physical Exam: Gen: NAD Lungs clear bilaterally Heart: RRR Bowel sounds present, passing gas Fundus Fundal Tone: Firm @ U  Lochia Amount: Scant  Perineum Appearance: Intact, Approximated     Data Review Labs: CBC Latest Ref Rng & Units 09/22/2017 09/21/2017 06/30/2017  WBC 3.6 - 11.0 K/uL 9.9 10.6 9.0  Hemoglobin 12.0 - 16.0 g/dL 11.8(L) 12.4 11.5  Hematocrit 35.0 - 47.0 % 34.7(L) 36.0 35.0  Platelets 150 - 440 K/uL 181 200 213   O POS  Assessment/Plan  Active Problems:   Labor and delivery, indication for care    Plan for discharge today.   Discharge Instructions: Per After Visit Summary. Activity: Advance as tolerated. Pelvic rest for 6 weeks.  Also refer to After Visit Summary Diet: Regular Medications: Allergies as of 09/22/2017   No Known Allergies     Medication List    TAKE these  medications   acetaminophen 650 MG CR tablet Commonly known as:  TYLENOL Take 650 mg by mouth every 8 (eight) hours as needed for pain.   docusate sodium 100 MG capsule Commonly known as:  COLACE Take 1 capsule (100 mg total) by mouth daily as needed.   ibuprofen 600 MG tablet Commonly known as:  ADVIL,MOTRIN Take 1 tablet (600 mg total) by mouth every 6 (six) hours.   norethindrone 0.35 MG tablet Commonly known as:  MICRONOR,CAMILA,ERRIN Take 1 tablet (0.35 mg total) by mouth daily.   prenatal multivitamin Tabs tablet Take 1 tablet by mouth daily at 12 noon.      Outpatient follow up: [facility] with Angela RoyalsMichelle Mcgrath, CNM @ 6 wks PP Postpartum contraception: Progestin only pill to start @ 4wks postpartum   Discharged Condition: good  Discharged to: home  Newborn Data: Disposition:home with mother  Apgars: APGAR (1 MIN): 8   APGAR (5 MINS): 9   APGAR (10 MINS):    Baby Feeding: Breast  Angela Mcgrath, CNM 09/22/2017 11:07 AM

## 2017-09-23 ENCOUNTER — Ambulatory Visit: Payer: Medicaid Other

## 2017-09-23 LAB — RPR: RPR Ser Ql: NONREACTIVE

## 2017-09-28 ENCOUNTER — Ambulatory Visit: Payer: Medicaid Other

## 2017-10-05 ENCOUNTER — Encounter: Payer: Self-pay | Admitting: Certified Nurse Midwife

## 2017-10-05 ENCOUNTER — Ambulatory Visit: Payer: Medicaid Other

## 2017-10-07 NOTE — OB Triage Note (Signed)
L&D OB Triage Note  SUBJECTIVE Angela Mcgrath is a 22 y.o. 433P1011 female at 4053w2d, EDD Estimated Date of Delivery: 09/16/17 who presented to triage with complaints of pubic bone pain. She feels good fetal movement, denies LOF and vaginal bleeding.                   OB History  Gravida Para Term Preterm AB Living  3 1 1  0 1 1  SAB TAB Ectopic Multiple Live Births     1 0 0 0 1       # Outcome Date GA Lbr Len/2nd Weight Sex Delivery Anes PTL Lv  3 Current           2 Term 07/15/12    F Vag-Forceps   LIV  1 SAB 07/2011        FD    Obstetric Comments  Vaginal delivery was with forceps           Medications Prior to Admission  Medication Sig Dispense Refill Last Dose  . Prenatal Vit-Fe Fumarate-FA (PRENATAL MULTIVITAMIN) TABS tablet Take 1 tablet by mouth daily at 12 noon.   09/10/2017 at Unknown time     OBJECTIVE             Nursing Evaluation:                         BP 117/76 (BP Location: Left Arm)   Pulse 84   Resp 16   Ht 5' (1.524 m)   Wt 199 lb (90.3 kg)   LMP 12/10/2016 (Exact Date)   BMI 38.86 kg/m                          Findings:  Reactive NST  NST was performed and has been reviewed by me.  NST INTERPRETATION: Category I  Mode: External Baseline Rate (A): 125 bpm Variability: Moderate Accelerations: 15 x 15 Decelerations: None Contraction Frequency (min): 2  ASSESSMENT Impression:  1.  Pregnancy:  G3P1011 at 2853w2d , EDD Estimated Date of Delivery: 09/16/17 2.  NST:  Category I  PLAN 1. Reassurance given. Pain decreased with tylenol.  2. Discharge home with standard labor precautions given to return to L&D or call the office for problems. 3. Continue routine prenatal care.    Doreene BurkeAnnie Marquesa Rath, CNM

## 2017-10-11 ENCOUNTER — Encounter: Payer: Self-pay | Admitting: Certified Nurse Midwife

## 2017-10-20 ENCOUNTER — Ambulatory Visit (INDEPENDENT_AMBULATORY_CARE_PROVIDER_SITE_OTHER): Payer: Medicaid Other | Admitting: Certified Nurse Midwife

## 2017-10-20 DIAGNOSIS — R102 Pelvic and perineal pain: Secondary | ICD-10-CM

## 2017-10-20 MED ORDER — NORGESTIMATE-ETH ESTRADIOL 0.25-35 MG-MCG PO TABS: 1.0000 | ORAL_TABLET | Freq: Every day | ORAL | 11 refills | Status: DC

## 2017-10-20 NOTE — Patient Instructions (Addendum)
Preventive Care 18-39 Years, Female Preventive care refers to lifestyle choices and visits with your health care provider that can promote health and wellness. What does preventive care include?  A yearly physical exam. This is also called an annual well check.  Dental exams once or twice a year.  Routine eye exams. Ask your health care provider how often you should have your eyes checked.  Personal lifestyle choices, including: ? Daily care of your teeth and gums. ? Regular physical activity. ? Eating a healthy diet. ? Avoiding tobacco and drug use. ? Limiting alcohol use. ? Practicing safe sex. ? Taking vitamin and mineral supplements as recommended by your health care provider. What happens during an annual well check? The services and screenings done by your health care provider during your annual well check will depend on your age, overall health, lifestyle risk factors, and family history of disease. Counseling Your health care provider may ask you questions about your:  Alcohol use.  Tobacco use.  Drug use.  Emotional well-being.  Home and relationship well-being.  Sexual activity.  Eating habits.  Work and work Statistician.  Method of birth control.  Menstrual cycle.  Pregnancy history.  Screening You may have the following tests or measurements:  Height, weight, and BMI.  Diabetes screening. This is done by checking your blood sugar (glucose) after you have not eaten for a while (fasting).  Blood pressure.  Lipid and cholesterol levels. These may be checked every 5 years starting at age 66.  Skin check.  Hepatitis C blood test.  Hepatitis B blood test.  Sexually transmitted disease (STD) testing.  BRCA-related cancer screening. This may be done if you have a family history of breast, ovarian, tubal, or peritoneal cancers.  Pelvic exam and Pap test. This may be done every 3 years starting at age 40. Starting at age 59, this may be done every 5  years if you have a Pap test in combination with an HPV test.  Discuss your test results, treatment options, and if necessary, the need for more tests with your health care provider. Vaccines Your health care provider may recommend certain vaccines, such as:  Influenza vaccine. This is recommended every year.  Tetanus, diphtheria, and acellular pertussis (Tdap, Td) vaccine. You may need a Td booster every 10 years.  Varicella vaccine. You may need this if you have not been vaccinated.  HPV vaccine. If you are 69 or younger, you may need three doses over 6 months.  Measles, mumps, and rubella (MMR) vaccine. You may need at least one dose of MMR. You may also need a second dose.  Pneumococcal 13-valent conjugate (PCV13) vaccine. You may need this if you have certain conditions and were not previously vaccinated.  Pneumococcal polysaccharide (PPSV23) vaccine. You may need one or two doses if you smoke cigarettes or if you have certain conditions.  Meningococcal vaccine. One dose is recommended if you are age 27-21 years and a first-year college student living in a residence hall, or if you have one of several medical conditions. You may also need additional booster doses.  Hepatitis A vaccine. You may need this if you have certain conditions or if you travel or work in places where you may be exposed to hepatitis A.  Hepatitis B vaccine. You may need this if you have certain conditions or if you travel or work in places where you may be exposed to hepatitis B.  Haemophilus influenzae type b (Hib) vaccine. You may need this if  you have certain risk factors.  Talk to your health care provider about which screenings and vaccines you need and how often you need them. This information is not intended to replace advice given to you by your health care provider. Make sure you discuss any questions you have with your health care provider. Document Released: 03/23/2001 Document Revised: 10/15/2015  Document Reviewed: 11/26/2014 Elsevier Interactive Patient Education  2018 [employer]. Ethinyl Estradiol; Norgestimate tablets What is this medicine? ETHINYL ESTRADIOL; NORGESTIMATE (ETH in il es tra DYE ole; nor JES ti mate) is an oral contraceptive. The products combine two types of female hormones, an estrogen and a progestin. They are used to prevent ovulation and pregnancy. Some products are also used to treat acne in females. This medicine may be used for other purposes; ask your health care provider or pharmacist if you have questions. COMMON BRAND NAME(S): Estarylla, MONO-LINYAH, MonoNessa, Norgestimate/Ethinyl Estradiol, Ortho Tri-Cyclen, Ortho Tri-Cyclen Lo, Ortho-Cyclen, Previfem, Sprintec, Tri-Estarylla, TRI-LINYAH, Tri-Lo-Estarylla, Tri-Lo-Marzia, Tri-Lo-Sprintec, Tri-Previfem, Tri-Sprintec, Tri-VyLibra, Trinessa, Minerva Areola What should I tell my health care provider before I take this medicine? They need to know if you have or ever had any of these conditions: -abnormal vaginal bleeding -blood vessel disease or blood clots -breast, cervical, endometrial, ovarian, liver, or uterine cancer -diabetes -gallbladder disease -heart disease or recent heart attack -high blood pressure -high cholesterol -kidney disease -liver disease -migraine headaches -stroke -systemic lupus erythematosus (SLE) -tobacco smoker -an unusual or allergic reaction to estrogens, progestins, other medicines, foods, dyes, or preservatives -pregnant or trying to get pregnant -breast-feeding How should I use this medicine? Take this medicine by mouth. To reduce nausea, this medicine may be taken with food. Follow the directions on the prescription label. Take this medicine at the same time each day and in the order directed on the package. Do not take your medicine more often than directed. Contact your pediatrician regarding the use of this medicine in children. Special care may be needed. This  medicine has been used in female children who have started having menstrual periods. A patient package insert for the product will be given with each prescription and refill. Read this sheet carefully each time. The sheet may change frequently. Overdosage: If you think you have taken too much of this medicine contact a poison control center or emergency room at once. NOTE: This medicine is only for you. Do not share this medicine with others. What if I miss a dose? If you miss a dose, refer to the patient information sheet you received with your medicine for direction. If you miss more than one pill, this medicine may not be as effective and you may need to use another form of birth control. What may interact with this medicine? Do not take this medicine with the following medication: -dasabuvir; ombitasvir; paritaprevir; ritonavir -ombitasvir; paritaprevir; ritonavir This medicine may also interact with the following medications: -acetaminophen -antibiotics or medicines for infections, especially rifampin, rifabutin, rifapentine, and griseofulvin, and possibly penicillins or tetracyclines -aprepitant -ascorbic acid (vitamin C) -atorvastatin -barbiturate medicines, such as phenobarbital -bosentan -carbamazepine -caffeine -clofibrate -cyclosporine -dantrolene -doxercalciferol -felbamate -grapefruit juice -hydrocortisone -medicines for anxiety or sleeping problems, such as diazepam or temazepam -medicines for diabetes, including pioglitazone -mineral oil -modafinil -mycophenolate -nefazodone -oxcarbazepine -phenytoin -prednisolone -ritonavir or other medicines for HIV infection or AIDS -rosuvastatin -selegiline -soy isoflavones supplements -St. John's wort -tamoxifen or raloxifene -theophylline -thyroid hormones -topiramate -warfarin This list may not describe all possible interactions. Give your health care provider a list of all  the medicines, herbs, non-prescription  drugs, or dietary supplements you use. Also tell them if you smoke, drink alcohol, or use illegal drugs. Some items may interact with your medicine. What should I watch for while using this medicine? Visit your doctor or health care professional for regular checks on your progress. You will need a regular breast and pelvic exam and Pap smear while on this medicine. You should also discuss the need for regular mammograms with your health care professional, and follow his or her guidelines for these tests. This medicine can make your body retain fluid, making your fingers, hands, or ankles swell. Your blood pressure can go up. Contact your doctor or health care professional if you feel you are retaining fluid. Use an additional method of contraception during the first cycle that you take these tablets. If you have any reason to think you are pregnant, stop taking this medicine right away and contact your doctor or health care professional. If you are taking this medicine for hormone related problems, it may take several cycles of use to see improvement in your condition. Do not use this product if you smoke and are over 56 years of age. Smoking increases the risk of getting a blood clot or having a stroke while you are taking birth control pills, especially if you are more than 24 years old. If you are a smoker who is 19 years of age or younger, you are strongly advised not to smoke while taking birth control pills. This medicine can make you more sensitive to the sun. Keep out of the sun. If you cannot avoid being in the sun, wear protective clothing and use sunscreen. Do not use sun lamps or tanning beds/booths. If you wear contact lenses and notice visual changes, or if the lenses begin to feel uncomfortable, consult your eye care specialist. In some women, tenderness, swelling, or minor bleeding of the gums may occur. Notify your dentist if this happens. Brushing and flossing your teeth regularly may  help limit this. See your dentist regularly and inform your dentist of the medicines you are taking. If you are going to have elective surgery, you may need to stop taking this medicine before the surgery. Consult your health care professional for advice. This medicine does not protect you against HIV infection (AIDS) or any other sexually transmitted diseases. What side effects may I notice from receiving this medicine? Side effects that you should report to your doctor or health care professional as soon as possible: -breast tissue changes or discharge -changes in vaginal bleeding during your period or between your periods -chest pain -coughing up blood -dizziness or fainting spells -headaches or migraines -leg, arm or groin pain -severe or sudden headaches -stomach pain (severe) -sudden shortness of breath -sudden loss of coordination, especially on one side of the body -speech problems -symptoms of vaginal infection like itching, irritation or unusual discharge -tenderness in the upper abdomen -vomiting -weakness or numbness in the arms or legs, especially on one side of the body -yellowing of the eyes or skin Side effects that usually do not require medical attention (report to your doctor or health care professional if they continue or are bothersome): -breakthrough bleeding and spotting that continues beyond the 3 initial cycles of pills -breast tenderness -mood changes, anxiety, depression, frustration, anger, or emotional outbursts -increased sensitivity to sun or ultraviolet light -nausea -skin rash, acne, or brown spots on the skin -weight gain (slight) This list may not describe all possible side effects. Call  your doctor for medical advice about side effects. You may report side effects to FDA at 1-800-FDA-1088. Where should I keep my medicine? Keep out of the reach of children. Store at room temperature between 15 and 30 degrees C (59 and 86 degrees F). Throw away any  unused medicine after the expiration date. NOTE: This sheet is a summary. It may not cover all possible information. If you have questions about this medicine, talk to your doctor, pharmacist, or health care provider.  2018 Elsevier/Gold Standard (2015-10-06 08:09:09)

## 2017-10-20 NOTE — Progress Notes (Signed)
Subjective:    Angela Mcgrath is a 22 y.o. 813P2012 Caucasian female who presents for a postpartum visit. She is 4 weeks postpartum following a spontaneous vaginal delivery at 40+5 gestational weeks. Anesthesia: epidural. I have fully reviewed the prenatal and intrapartum course.  Postpartum course has been uncomplicated. Baby's course has been uncomplicated. Baby is feeding by formula. Bleeding no bleeding. Bowel function is normal. Bladder function is normal.   Patient is not sexually active.Contraception method is abstinence. Postpartum depression screening: negative. Score 4.  Last pap 07/17/2015 and was Negative.  Reports intermittent pelvic pain. Denies difficulty breathing or respiratory distress, chest pain, abdominal pain, excessive vaginal bleeding, dysuria, and leg pain or swelling.   The following portions of the patient's history were reviewed and updated as appropriate: allergies, current medications, past medical history, past surgical history and problem list.  Review of Systems  Pertinent items are noted in HPI.   Objective:   BP 105/70   Pulse 86   Ht 5' (1.524 m)   Wt 191 lb 9 oz (86.9 kg)   Breastfeeding? No   BMI 37.41 kg/m   General:  alert, cooperative and no distress   Breasts:  deferred, no complaints  Lungs: clear to auscultation bilaterally  Heart:  regular rate and rhythm  Abdomen: soft, nontender   Vulva: normal  Vagina: normal vagina  Cervix:  closed  Corpus: Well-involuted  Adnexa:  Non-palpable        Assessment:   Postpartum exam Four (4) wks s/p spontaneous vaginal irth Formula feeding Depression screening Contraception counseling   Plan:   Rx: Sprintec, see orders.   Encourage routine health maintenance.   Referral to Pelvic Floor Physical Therapy, see orders.   Reviewed red flag symptoms and when to call.   Follow up in: 8 months for Annual Exam or earlier if needed.   Angela Mcgrath Angela Mcgrath, CNM [facility],  [facility]

## 2017-11-09 ENCOUNTER — Other Ambulatory Visit: Payer: Self-pay

## 2017-11-09 ENCOUNTER — Ambulatory Visit: Payer: Medicaid Other | Attending: Certified Nurse Midwife

## 2017-11-09 DIAGNOSIS — R350 Frequency of micturition: Secondary | ICD-10-CM

## 2017-11-09 DIAGNOSIS — R293 Abnormal posture: Secondary | ICD-10-CM

## 2017-11-09 DIAGNOSIS — K5902 Outlet dysfunction constipation: Secondary | ICD-10-CM

## 2017-11-09 DIAGNOSIS — M791 Myalgia, unspecified site: Secondary | ICD-10-CM

## 2017-11-09 DIAGNOSIS — M62838 Other muscle spasm: Secondary | ICD-10-CM

## 2017-11-09 NOTE — Therapy (Signed)
[facility] MAIN [facility] SERVICES [address] [place], [place], [address] Phone: [phone]   Fax:  [phone]  Physical Therapy Evaluation  Patient Details  Name: Angela Mcgrath MRN: [id number] Date of Birth: 1995/05/02 Referring Provider (PT): Doreene Burke   Encounter Date: 11/09/2017  PT End of Session - 11/11/17 1042    Visit Number  1    Number of Visits  12    Date for PT Re-Evaluation  02/03/18    Authorization Type  Medicaid    Authorization - Visit Number  1    Authorization - Number of Visits  4    PT Start Time  1330    PT Stop Time  1430    PT Time Calculation (min)  60 min    Activity Tolerance  Patient tolerated treatment well;No increased pain    Behavior During Therapy  WFL for tasks assessed/performed       Past Medical History:  Diagnosis Date  . Anxiety   . Depression   . Family history of Downs syndrome    FOB-brother deceased  . IBS (irritable bowel syndrome)   . Increased BMI     Past Surgical History:  Procedure Laterality Date  . NO PAST SURGERIES    . none      There were no vitals filed for this visit.     Pelvic Floor Physical Therapy Evaluation and Assessment  SCREENING  Falls in last 6 mo: no    Patient's communication preference:   Red Flags:  Have you had any night sweats? no Unexplained weight loss? no Saddle anesthesia? no Unexplained changes in bowel or bladder habits? no  SUBJECTIVE  Patient reports: Having hip pain B with certain activities, sometimes "catches" when trying to stand up.  Social/Family/Vocational History:   Not working yet  Recent Procedures/Tests/Findings:  none  Obstetrical History: 1 uncomplicated vaginal delivery, had tearing and stitches with second delivery 09/21/2017.  Gynecological History: N/A  Urinary History: Having a little bit of leakage with coughing/sneezing  Gastrointestinal History: Normal BM's following delivery.  Sexual  activity/pain: Having pain with initial penetration   Location of pain: B anterior hips and above pubic symphysis Current pain:  2/10  Max pain:  7/10 Least pain:  2/10 Nature of pain: *Dull achy, sometimes sharp  Patient Goals: To be able to walk and intercourse without pain.   OBJECTIVE  Posture/Observations:  Sitting:  slouched Standing: L rib-shift. Anterior pelvic tilt, genu valgus, pronating feet B.  Palpation/Segmental Motion/Joint Play: Minor TTP with palpation to SIJ B.   Special tests:   Stork: negative. Supine-to-sit: negative Leg-length: normal  Range of Motion/Flexibilty:  Spine: WNL with pulling during B SB and rotation in the anterior hip. Hips: not assessed but hip extensors not long enough to allow for neutral extension in standing.   Strength/MMT: Deferred to next visit. LE MMT  LE MMT Left Right  Hip flex:  (L2) /5 /5  Hip ext: /5 /5  Hip abd: /5 /5  Hip add: /5 /5  Hip IR /5 /5  Hip ER /5 /5     Abdominal:  Palpation: Extremely TTP to B Iliacus and Psoas as well as through lower abdomen generally. Diastasis: none noted  Pelvic Floor External Exam: Deferred indefinitely Introitus Appears:  Skin integrity:  Palpation: Cough: Prolapse visible?: Scar mobility:  Internal Vaginal Exam: Deferred indefinitely Strength (PERF):  Symmetry: Palpation: Prolapse:   Internal Rectal Exam: Not indicated Strength (PERF): Symmetry: Palpation: Prolapse:  Gait Analysis: Anterior pelvic tilt   INTERVENTIONS THIS SESSION: Manual: Performed STM and TP release tto B Iliacus to decrease pain and spasm and allow for improved posture and decreased pain Dry needle: Performed TPDN to B Iliacus to decrease pain and spasm and allow for improved posture and decreased pain. Therex: educated on and practiced hip-flexor stretch and mini-marches to continue to decrease tension pulling into anterior pelvic tilt and to improve deep-core stability to allow for  long-term pain relief.  Total time: 60 min.     [facility] PT Assessment - 11/11/17 0001      Assessment   Medical Diagnosis  Pain of joint in pelvic region and hip affecting pregnancy    Referring Provider (PT)  Doreene Burke    Onset Date/Surgical Date  06/15/17    Next MD Visit  09/19/2017    Prior Therapy  none      Precautions   Precautions  --   [redacted] weeks pregnant     Restrictions   Weight Bearing Restrictions  No      Balance Screen   Has the patient fallen in the past 6 months  No      Home Environment   Living Environment  Private residence    Living Arrangements  Spouse/significant other;Parent    Available Help at Discharge  Family    Type of Home  Mobile home    Home Access  Stairs to enter    Entrance Stairs-Number of Steps  2    Entrance Stairs-Rails  Right;Left    Home Layout  One level                Objective measurements completed on examination: See above findings.              PT Education - 11/11/17 1042    Education Details  See Pt. Instructions and Interventions this session.    Person(s) Educated  Patient    Methods  Explanation;Demonstration;Verbal cues;Handout;Tactile cues    Comprehension  Verbalized understanding;Returned demonstration;Verbal cues required;Tactile cues required       PT Short Term Goals - 11/11/17 1100      PT SHORT TERM GOAL #1   Title  Patient will describe pain no greater than 4/10 during walking, to demonstrate improved functional ability.    Baseline  Has pain as high as 7/10 with standing or walking for more than 10 min.    Time  4    Period  Weeks    Status  New    Target Date  12/09/17      PT SHORT TERM GOAL #2   Title  Patient will demonstrate a coordinated contraction, relaxation, and bulge of the pelvic floor muscles to demonstrate functional recruitment and motion and allow for further strengthening.    Baseline  Pt. has pain with intercourse and mild SUI, demonstrating PFM dysfunction  following tearing with delivery.    Time  4    Period  Weeks    Status  New    Target Date  12/09/17      PT SHORT TERM GOAL #3   Title  Patient will demonstrate coordinated diaphragmatic breathing with pelvic tilts to demonstrate improved control of diaphragm and TA, to allow for further strengthening of core musculature and decreased pelvic floor spasms for decreased pain with intercourse and decreased SUI    Baseline  Pt. 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    Baseline  7/10 at worst    Time  12    Period  Weeks    Status  New    Target Date  02/03/18      PT LONG TERM GOAL #2   Title  "Patient will report no episodes of SUI over the course of the prior two weeks to demonstrate improved functional ability.    Baseline  Having SUI with coughing/sneezing    Time  12    Period  Weeks    Status  New    Target Date  02/03/18      PT LONG TERM GOAL #3   Title  Patient will report no pain with intercourse to demonstrate improved functional ability.    Baseline  Having pain with initial penetration during intercourse    Time  12    Period  Weeks    Status  New    Target Date  02/03/18             Plan - 11/11/17 1052    Clinical Impression Statement  Pt. is a 22 y/o female who presents today with cheif c/o pain in B hips and at pubic symphysis and vaginally with intercourse following the delivery of her second child as well as minor SUI. Her PMH is significant for 2 vaginal deliveriers with tearing during second delivery. Her clinical exam reveals poor  posture with an exaggerated anterior pelvic tilt, L rib shift/spinal mal-alignment, decreased deep-core strength and spasms surrounding the pelvis, most notably in the deep hip flexors, contributing to her hyperlordotic posture and pain.  She will benefit from skilled PT aimed at addressing noted deficits and continuing to assess for and address other potential causes of pain and SUI.     History and Personal Factors relevant to plan of care:  recently post-partum with severe pain throughout and following pregnancy.    Clinical Presentation  Stable    Clinical Decision Making  Moderate    Rehab Potential  Good    PT Frequency  1x / week    PT Duration  12 weeks    PT Treatment/Interventions  ADLs/Self Care Home Management;Biofeedback;Electrical Stimulation;Gait training;Stair training;Functional mobility training;Therapeutic activities;Therapeutic exercise;Patient/family education;Neuromuscular re-education;Manual techniques;Scar mobilization;Taping;Dry needling    PT Next Visit Plan  Perform DMN to B Psoas and Pectineus and through back PRN, Educate on TA recruitment and posture/postpartum precaustions.    Consulted and Agree with Plan of Care  Patient       Patient will benefit from skilled therapeutic intervention in order to improve the following deficits and impairments:  Abnormal gait, Increased fascial restricitons, Impaired sensation, Improper body mechanics, Pain, Hypermobility, Increased muscle spasms, Postural dysfunction, Decreased activity tolerance, Decreased endurance, Decreased range of motion, Decreased strength, Difficulty walking, Increased edema, Impaired flexibility, Obesity  Visit Diagnosis: Other muscle spasm  Abnormal posture  Myalgia  Constipation due to outlet dysfunction  Urinary frequency     Problem List Patient Active Problem List   Diagnosis Date Noted  .  Dysmenorrhea 07/16/2015  . Vaginitis 10/03/2014   Cleophus Molt DPT, ATC Cleophus Molt 11/11/2017, 11:09 AM  [place] [facility] MAIN [facility] SERVICES [address] [place], [place], [address] Phone: [phone]   Fax:  [phone]  Name: Angela Mcgrath MRN: [id number] Date of Birth: [birth year]

## 2017-11-09 NOTE — Patient Instructions (Signed)
Flexors, Lunge  Hip Flexor Stretch: Proposal Pose    Maintain pelvic tuck under, lift pubic bone toward navel. Engage posterior hip muscles (firm glute muscles of leg in back position) and shift forward until you feel stretch on front of leg that is down. To increase stretch, maintain balance and ease hips forward. You may use one hand on a chair for balance if needed. Hold for __5__ breaths. Repeat __2-3__ times each leg.  Do _1-2__ times per day.     Try to feel the same lower belly muscles activate when lying on your back by placing your fingers between your hip bone and your belly-button. Exhale and pull the belly-button toward the backbone.  Repeat this __15x2_ times _1-2__ times per day

## 2017-11-30 ENCOUNTER — Ambulatory Visit: Payer: Medicaid Other

## 2017-12-08 ENCOUNTER — Ambulatory Visit: Payer: Medicaid Other

## 2017-12-15 ENCOUNTER — Ambulatory Visit: Payer: Self-pay

## 2017-12-22 ENCOUNTER — Ambulatory Visit: Payer: Self-pay

## 2018-01-04 ENCOUNTER — Telehealth: Payer: Self-pay | Admitting: Certified Nurse Midwife

## 2018-01-04 NOTE — Telephone Encounter (Signed)
The patient called and stated she needs to speak with a nurse in regards to her needing to change her birth control due to the side effects. No other information was disclosed other than wanting a call back. Please advise.

## 2018-01-10 ENCOUNTER — Other Ambulatory Visit: Payer: Self-pay

## 2018-01-10 MED ORDER — IBUPROFEN 600 MG PO TABS: 600.0000 mg | ORAL_TABLET | Freq: Four times a day (QID) | ORAL | 0 refills | Status: DC

## 2018-01-10 NOTE — Telephone Encounter (Signed)
Refill sent per pt request through mychart.

## 2018-04-06 ENCOUNTER — Encounter: Payer: Self-pay | Admitting: Emergency Medicine

## 2018-04-06 ENCOUNTER — Emergency Department
Admission: EM | Admit: 2018-04-06 | Discharge: 2018-04-06 | Disposition: A | Discharge status: Home / Self Care | Payer: Self-pay | Attending: Emergency Medicine | Admitting: Emergency Medicine

## 2018-04-06 ENCOUNTER — Other Ambulatory Visit: Payer: Self-pay

## 2018-04-06 DIAGNOSIS — Z87891 Personal history of nicotine dependence: Secondary | ICD-10-CM | POA: Insufficient documentation

## 2018-04-06 DIAGNOSIS — J029 Acute pharyngitis, unspecified: Secondary | ICD-10-CM | POA: Insufficient documentation

## 2018-04-06 DIAGNOSIS — Z79899 Other long term (current) drug therapy: Secondary | ICD-10-CM | POA: Insufficient documentation

## 2018-04-06 LAB — GROUP A STREP BY PCR: Group A Strep by PCR: NOT DETECTED

## 2018-04-06 MED ORDER — LIDOCAINE VISCOUS HCL 2 % MT SOLN
5.0000 mL | Freq: Once | OROMUCOSAL | Status: AC
  Administered 2018-04-06: 5 mL via OROMUCOSAL
  Filled 2018-04-06: qty 15

## 2018-04-06 MED ORDER — DIPHENHYDRAMINE HCL 12.5 MG/5ML PO ELIX
12.5000 mg | ORAL_SOLUTION | Freq: Once | ORAL | Status: AC
  Administered 2018-04-06: 12.5 mg via ORAL
  Filled 2018-04-06: qty 5

## 2018-04-06 MED ORDER — MAGIC MOUTHWASH W/LIDOCAINE: 5.0000 mL | Freq: Four times a day (QID) | ORAL | 0 refills | Status: DC

## 2018-04-06 NOTE — ED Provider Notes (Signed)
[facility] Emergency Department Provider Note   ____________________________________________   First MD Initiated Contact with Patient 04/06/18 1518     (approximate)  I have reviewed the triage vital signs and the nursing notes.   HISTORY  Chief Complaint Sore Throat   HPI Angela Mcgrath is a 23 y.o. female patient presents with sore throat which started yesterday.  Patient states yesterday the throat was dry but woke up this morning with increased pain with swallowing.  Patient denies URI signs and symptoms.  Patient denies nausea, vomiting, diarrhea.  Patient has not taken flu shot for this season.  Patient rates the pain as a 5/10.  Patient described the pain is "sore".  Patient is taking tolerate fluids with discomfort but has not tried solid foods today.   No palliative measures for complaint.   Past Medical History:  Diagnosis Date  . Anxiety   . Depression   . Family history of Downs syndrome    FOB-brother deceased  . IBS (irritable bowel syndrome)   . Increased BMI     Patient Active Problem List   Diagnosis Date Noted  . Dysmenorrhea 07/16/2015  . Vaginitis 10/03/2014    Past Surgical History:  Procedure Laterality Date  . NO PAST SURGERIES    . none      Prior to Admission medications   Medication Sig Start Date End Date Taking? Authorizing Provider  acetaminophen (TYLENOL) 650 MG CR tablet Take 650 mg by mouth every 8 (eight) hours as needed for pain.    [provider]  ibuprofen (ADVIL,MOTRIN) 600 MG tablet Take 1 tablet (600 mg total) by mouth every 6 (six) hours. 01/10/18   Doreene Burke, CNM  magic mouthwash w/lidocaine SOLN Take 5 mLs by mouth 4 (four) times daily. 5 mL for swish and swallow 3 times daily or before meals. 04/06/18   Joni Reining, PA-C  norgestimate-ethinyl estradiol (ORTHO-CYCLEN,SPRINTEC,PREVIFEM) 0.25-35 MG-MCG tablet Take 1 tablet by mouth daily. 10/20/17   Gunnar Bulla, CNM     Allergies Patient has no known allergies.  Family History  Problem Relation Age of Onset  . Heart attack Mother        51's  . Diabetes Mother   . COPD Father   . Asthma Father   . Diabetes Maternal Grandmother   . Heart disease Maternal Grandmother   . Diabetes Maternal Grandfather   . Colon cancer Neg Hx   . Ovarian cancer Neg Hx   . Breast cancer Neg Hx     Social History Social History   Tobacco Use  . Smoking status: Former Games developer  . Smokeless tobacco: Never Used  Substance Use Topics  . Alcohol use: No    Alcohol/week: 0.0 standard drinks  . Drug use: No    Review of Systems  Constitutional: No fever/chills Eyes: No visual changes. ENT: Sore throat.   Cardiovascular: Denies chest pain. Respiratory: Denies shortness of breath. Gastrointestinal: No abdominal pain.  No nausea, no vomiting.  No diarrhea.  No constipation. Genitourinary: Negative for dysuria. Musculoskeletal: Negative for back pain. Skin: Negative for rash. Neurological: Negative for headaches, focal weakness or numbness. Psychiatric:  Anxiety and depression.   ____________________________________________   PHYSICAL EXAM:  VITAL SIGNS: ED Triage Vitals  Enc Vitals Group     BP      Pulse      Resp      Temp      Temp src      SpO2  Weight      Height      Head Circumference      Peak Flow      Pain Score      Pain Loc      Pain Edu?      Excl. in GC?     Constitutional: Alert and oriented. Well appearing and in no acute distress. Eyes: Conjunctivae are normal. PERRL. EOMI. Head: Atraumatic. Nose: No congestion/rhinnorhea. Mouth/Throat: Mucous membranes are moist.  Oropharynx erythematous.  Edematous nonexudative tonsils. Neck: No stridor.  Hematological/Lymphatic/Immunilogical: No cervical lymphadenopathy. Cardiovascular: Normal rate, regular rhythm. Grossly normal heart sounds.  Good peripheral circulation. Respiratory: Normal respiratory effort.  No retractions.  Lungs CTAB. Gastrointestinal: Soft and nontender. No distention. No abdominal bruits. No CVA tenderness. Musculoskeletal: No lower extremity tenderness nor edema.  No joint effusions. Neurologic:  Normal speech and language. No gross focal neurologic deficits are appreciated. No gait instability. Skin:  Skin is warm, dry and intact. No rash noted. Psychiatric: Mood and affect are normal. Speech and behavior are normal.  ____________________________________________   LABS (all labs ordered are listed, but only abnormal results are displayed)  Labs Reviewed  GROUP A STREP BY PCR   ____________________________________________  EKG   ____________________________________________  RADIOLOGY  ED MD interpretation:    Official radiology report(s): No results found.  ____________________________________________   PROCEDURES  Procedure(s) performed (including Critical Care):  Procedures   ____________________________________________   INITIAL IMPRESSION / ASSESSMENT AND PLAN / ED COURSE  As part of my medical decision making, I reviewed the following data within the electronic MEDICAL RECORD NUMBER     Sore throat secondary to viral pharyngitis.  Discussed negative strep test results with patient.  Patient given discharge care instruction advised take medication as directed.  Advised to follow-up PCP.      ____________________________________________   FINAL CLINICAL IMPRESSION(S) / ED DIAGNOSES  Final diagnoses:  Viral pharyngitis     ED Discharge Orders         Ordered    magic mouthwash w/lidocaine SOLN  4 times daily    Note to Pharmacy:  Mix 30 mL of Benadryl,30 ml of viscous Lidocaine, and 30 ml of Maalox   04/06/18 1651           Note:  This document was prepared using Dragon voice recognition software and may include unintentional dictation errors.    Joni Reining, PA-C 04/06/18 1656    Dionne Bucy, MD 04/06/18 [phone]

## 2018-04-06 NOTE — ED Triage Notes (Signed)
Presents with dry throat yesterday but woke up with sore throat this am  Increased pain with swallowing  No fever

## 2018-08-18 IMAGING — US US OB TRANSVAGINAL
1 series · 14 of 28 positions shown · non-contrast
Comparison: CT 04/21/2016.

CLINICAL DATA: Abdominal cramping.

EXAM:
OBSTETRIC <14 WK US AND TRANSVAGINAL OB US
TECHNIQUE: Both transabdominal and transvaginal ultrasound examinations were
performed for complete evaluation of the gestation as well as the
maternal uterus, adnexal regions, and pelvic cul-de-sac.
Transvaginal technique was performed to assess early pregnancy.

[Series 1: us ob transvaginal · 0.11mm/px · 14 of 106 slices shown]
[im 4/106]
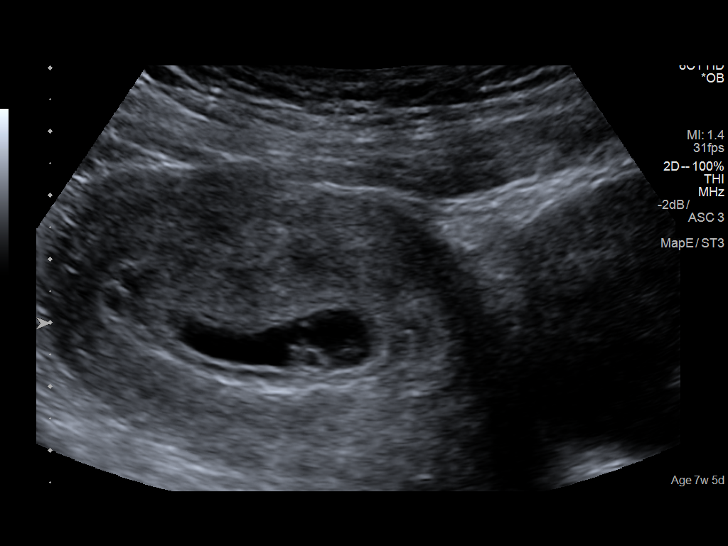
[im 12/106]
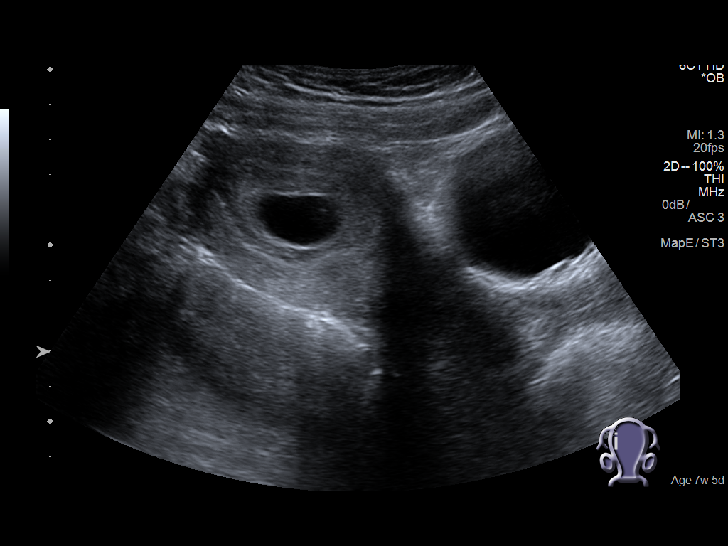
[im 20/106]
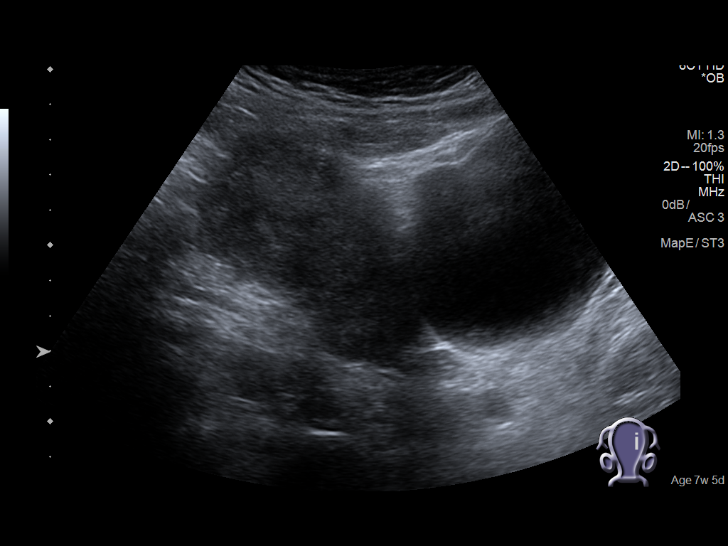
[im 28/106]
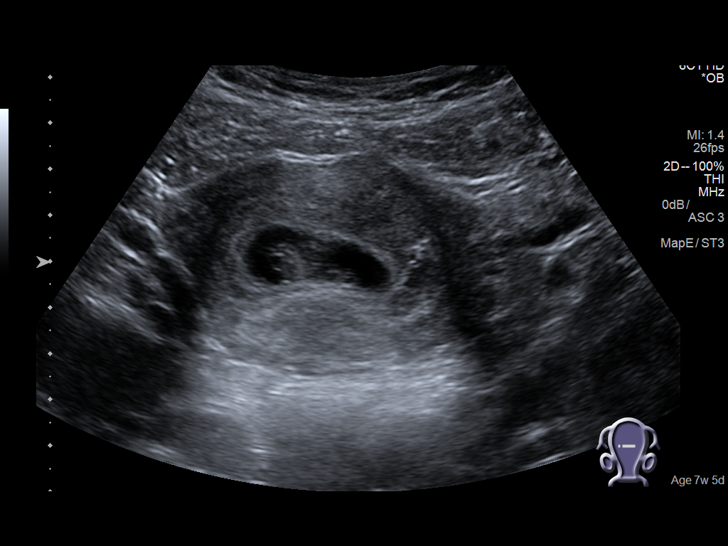
[im 36/106]
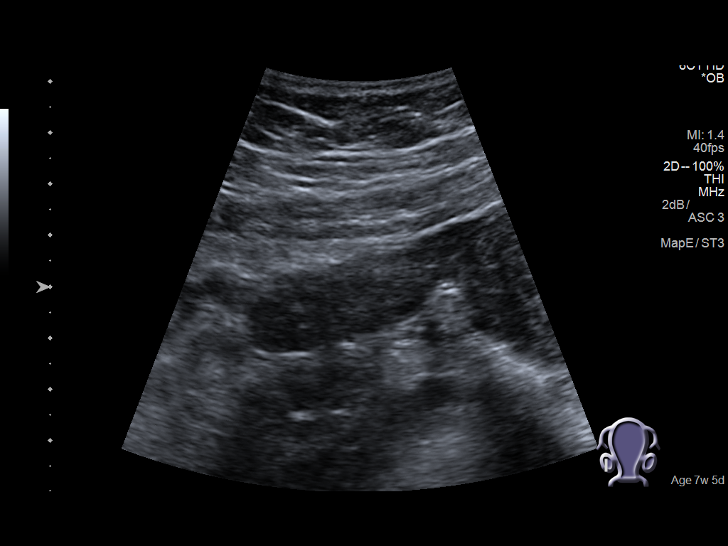
[im 43/106]
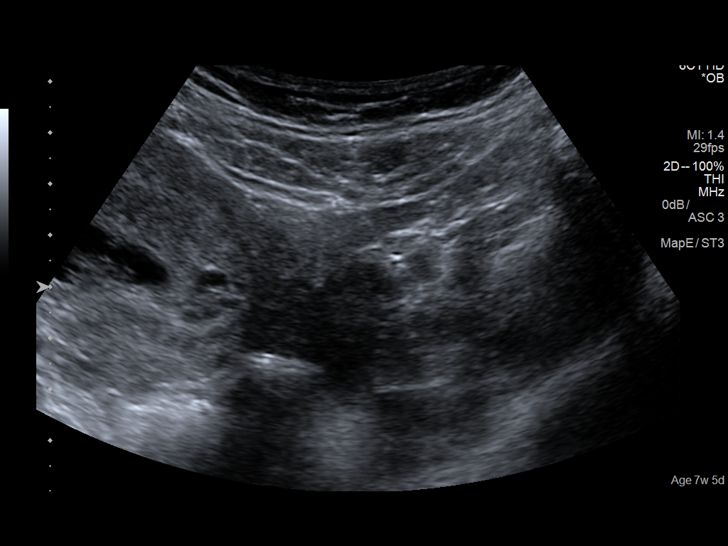
[im 51/106]
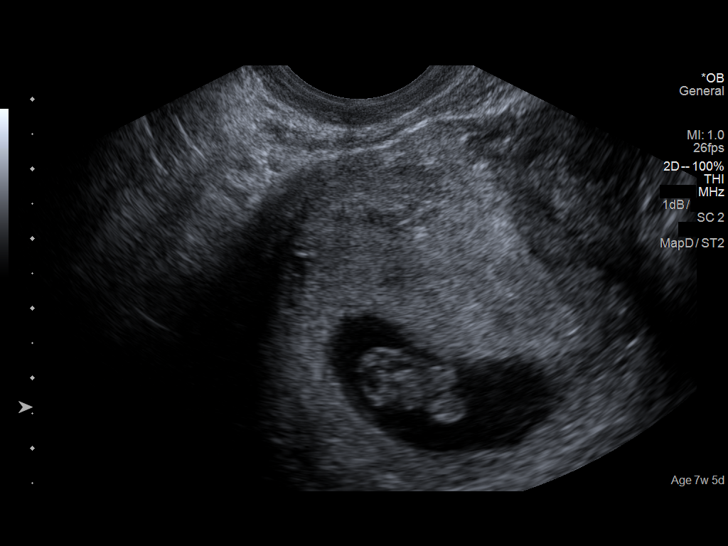
[im 59/106]
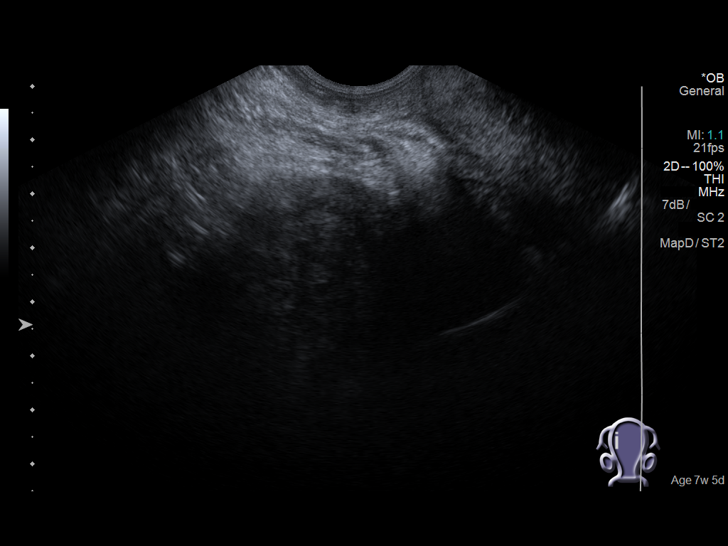
[im 67/106]
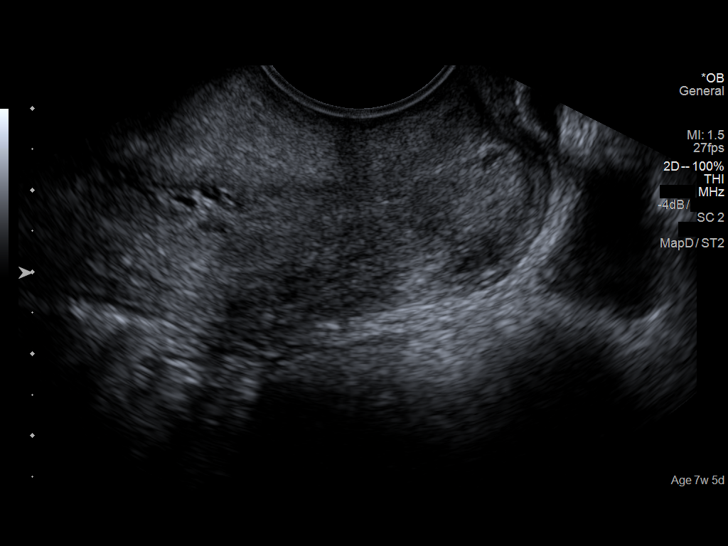
[im 74/106]
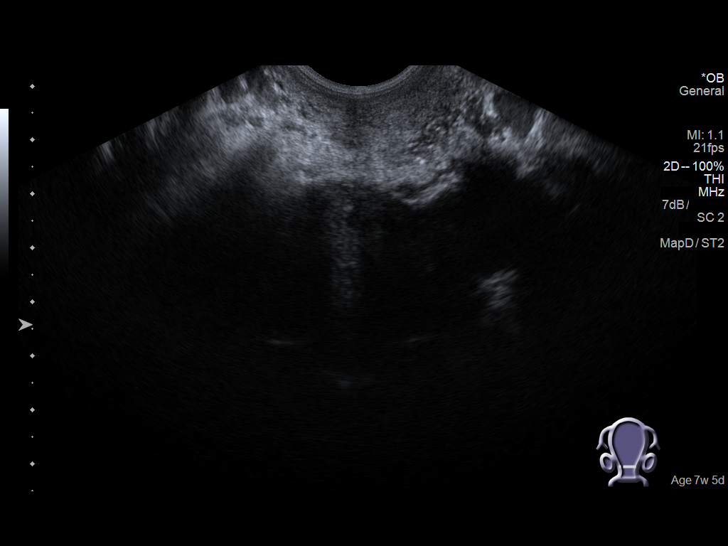
[im 82/106]
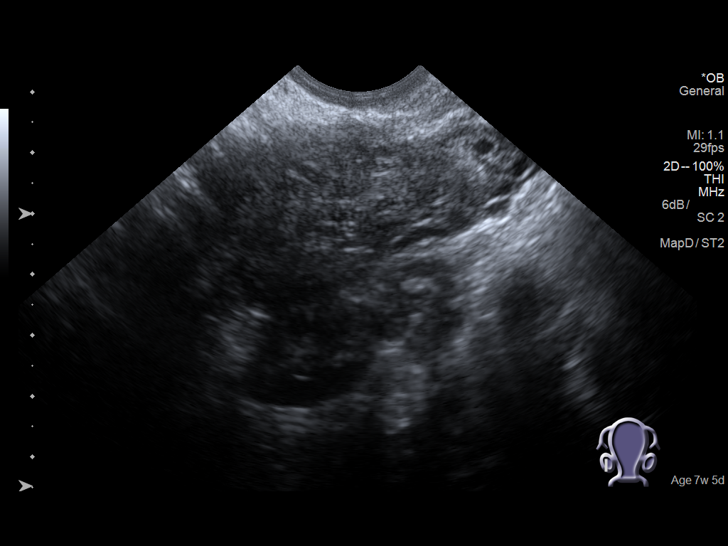
[im 90/106]
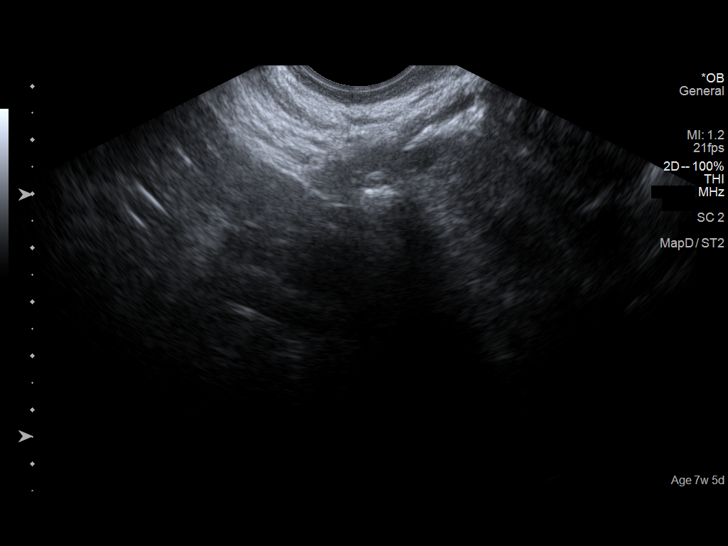
[im 98/106]
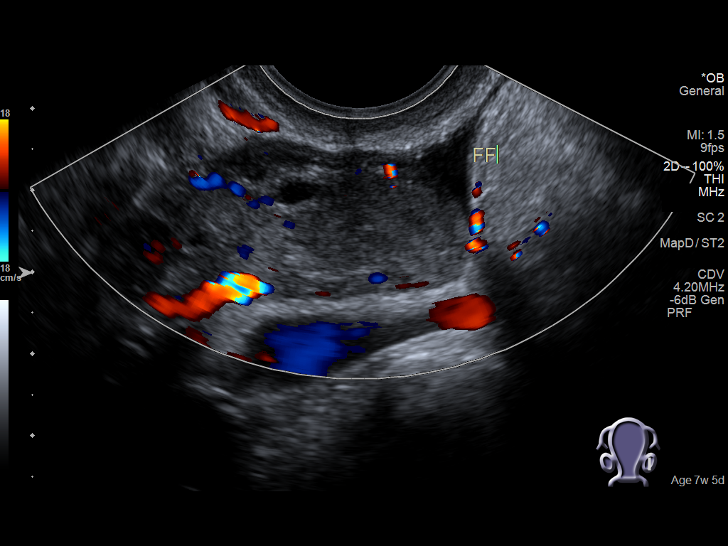
[im 106/106]
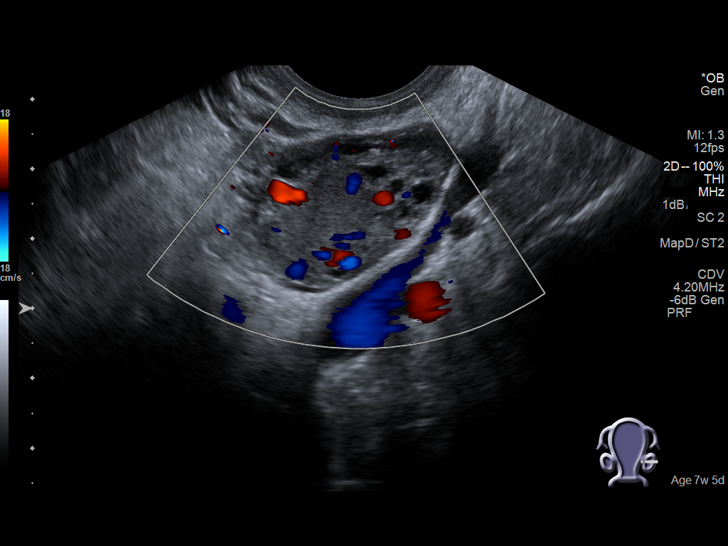

[14 of 28 positions shown; findings below may reference images not displayed]

FINDINGS: Intrauterine gestational sac: Single

Yolk sac:  Present

Embryo:  Present

Cardiac Activity: Present

Heart Rate: 152  bpm

CRL:  1.4  mm   7 w   5 d                  US EDC: 09/16/2017

Subchorionic hemorrhage: 1.3 x 0.5 x 0.5 cm small subchorionic
hemorrhage.

Maternal uterus/adnexae: 2.5 x 2.0 x 2.0 cm isoechoic focus in the
left ovary. This could perhaps represent a corpus luteal cyst.
Follow-up exam is suggested.

Small amount of free pelvic fluid.
IMPRESSION: 1.  Single viable injury pregnancy at 7 weeks 5 days.

2. Small subchorionic hemorrhage. Follow-up exam suggested to
demonstrate resolution.

3. Probable corpus luteal cyst left ovary. Small amount of free
pelvic fluid. Follow-up on subsequent exams suggested .

## 2019-02-15 ENCOUNTER — Other Ambulatory Visit: Payer: Self-pay

## 2019-02-15 ENCOUNTER — Emergency Department: Payer: Self-pay

## 2019-02-15 ENCOUNTER — Emergency Department
Admission: EM | Admit: 2019-02-15 | Discharge: 2019-02-15 | Disposition: A | Discharge status: Home / Self Care | Payer: Self-pay | Attending: Emergency Medicine | Admitting: Emergency Medicine

## 2019-02-15 DIAGNOSIS — J0100 Acute maxillary sinusitis, unspecified: Secondary | ICD-10-CM | POA: Insufficient documentation

## 2019-02-15 DIAGNOSIS — Z20822 Contact with and (suspected) exposure to covid-19: Secondary | ICD-10-CM | POA: Insufficient documentation

## 2019-02-15 DIAGNOSIS — Z87891 Personal history of nicotine dependence: Secondary | ICD-10-CM | POA: Insufficient documentation

## 2019-02-15 DIAGNOSIS — Z79899 Other long term (current) drug therapy: Secondary | ICD-10-CM | POA: Insufficient documentation

## 2019-02-15 LAB — INFLUENZA PANEL BY PCR (TYPE A & B)
Influenza A By PCR: NEGATIVE
Influenza B By PCR: NEGATIVE

## 2019-02-15 LAB — GROUP A STREP BY PCR: Group A Strep by PCR: NOT DETECTED

## 2019-02-15 LAB — SARS CORONAVIRUS 2 (TAT 6-24 HRS): SARS Coronavirus 2: NEGATIVE

## 2019-02-15 MED ORDER — FEXOFENADINE-PSEUDOEPHED ER 60-120 MG PO TB12: 1.0000 | ORAL_TABLET | Freq: Two times a day (BID) | ORAL | 0 refills | Status: DC

## 2019-02-15 MED ORDER — AMOXICILLIN 500 MG PO CAPS: 500.0000 mg | ORAL_CAPSULE | Freq: Three times a day (TID) | ORAL | 0 refills | Status: DC

## 2019-02-15 MED ORDER — BENZONATATE 100 MG PO CAPS: 200.0000 mg | ORAL_CAPSULE | Freq: Three times a day (TID) | ORAL | 0 refills | Status: AC | PRN

## 2019-02-15 NOTE — ED Notes (Addendum)
Pt with c/o congestion and and upset stomach. Pt states this has been going on for 2 weeks. Occasional diarrhea, nausea. Denies vomiting and SOB. Lungs clear, no wheezing, nonproductive cough.

## 2019-02-15 NOTE — ED Triage Notes (Signed)
Pt c/o cough with chest and sinus congestion with bodyaches and fatigue for over the past week and was seen an tested negative for covid at [facility] last week.

## 2019-02-15 NOTE — Discharge Instructions (Signed)
Take medication as directed.

## 2019-02-15 NOTE — ED Provider Notes (Signed)
[facility] Emergency Department Provider Note   ____________________________________________   First MD Initiated Contact with Patient 02/15/19 1115     (approximate)  I have reviewed the triage vital signs and the nursing notes.   HISTORY  Chief Complaint URI    HPI Angela Mcgrath is a 24 y.o. female patient complain nasal congestion, cough, chest congestion body aches, fatigue for more than a week.  Patient states she tested negative for COVID-19 approximately 10 days ago.  Patient denies recent travel or known exposure to COVID-19.         Past Medical History:  Diagnosis Date  . Anxiety   . Depression   . Family history of Downs syndrome    FOB-brother deceased  . IBS (irritable bowel syndrome)   . Increased BMI     Patient Active Problem List   Diagnosis Date Noted  . Dysmenorrhea 07/16/2015  . Vaginitis 10/03/2014    Past Surgical History:  Procedure Laterality Date  . NO PAST SURGERIES    . none      Prior to Admission medications   Medication Sig Start Date End Date Taking? Authorizing Provider  acetaminophen (TYLENOL) 650 MG CR tablet Take 650 mg by mouth every 8 (eight) hours as needed for pain.    [provider]  amoxicillin (AMOXIL) 500 MG capsule Take 1 capsule (500 mg total) by mouth 3 (three) times daily. 02/15/19   Joni Reining, PA-C  benzonatate (TESSALON PERLES) 100 MG capsule Take 2 capsules (200 mg total) by mouth 3 (three) times daily as needed. 02/15/19 02/15/20  Joni Reining, PA-C  fexofenadine-pseudoephedrine (ALLEGRA-D) 60-120 MG 12 hr tablet Take 1 tablet by mouth 2 (two) times daily. 02/15/19   Joni Reining, PA-C  ibuprofen (ADVIL,MOTRIN) 600 MG tablet Take 1 tablet (600 mg total) by mouth every 6 (six) hours. 01/10/18   Doreene Burke, CNM  magic mouthwash w/lidocaine SOLN Take 5 mLs by mouth 4 (four) times daily. 5 mL for swish and swallow 3 times daily or before meals. 04/06/18   Joni Reining,  PA-C  norgestimate-ethinyl estradiol (ORTHO-CYCLEN,SPRINTEC,PREVIFEM) 0.25-35 MG-MCG tablet Take 1 tablet by mouth daily. 10/20/17   Gunnar Bulla, CNM    Allergies Bee venom  Family History  Problem Relation Age of Onset  . Heart attack Mother        25's  . Diabetes Mother   . COPD Father   . Asthma Father   . Diabetes Maternal Grandmother   . Heart disease Maternal Grandmother   . Diabetes Maternal Grandfather   . Colon cancer Neg Hx   . Ovarian cancer Neg Hx   . Breast cancer Neg Hx     Social History Social History   Tobacco Use  . Smoking status: Former Games developer  . Smokeless tobacco: Never Used  Substance Use Topics  . Alcohol use: No    Alcohol/week: 0.0 standard drinks  . Drug use: No    Review of Systems Constitutional: No fever/chills Eyes: No visual changes. ENT: Sore throat.  Sinus congestion. Cardiovascular: Denies chest pain. Respiratory: Denies shortness of breath.  Nonproductive cough. Gastrointestinal: No abdominal pain.  No nausea, no vomiting.  No diarrhea.  No constipation.  Genitourinary: Negative for dysuria. Musculoskeletal: Negative for back pain. Skin: Negative for rash. Neurological: Negative for headaches, focal weakness or numbness. Psychiatric:  Anxiety and depression. Allergic/Immunilogical: Bee venom ____________________________________________   PHYSICAL EXAM:  VITAL SIGNS: ED Triage Vitals  Enc Vitals Group  BP 02/15/19 1100 107/73     Pulse Rate 02/15/19 1100 88     Resp 02/15/19 1100 18     Temp 02/15/19 1100 98.6 F (37 C)     Temp Source 02/15/19 1100 Oral     SpO2 02/15/19 1100 99 %     Weight 02/15/19 1100 200 lb (90.7 kg)     Height 02/15/19 1100 5' (1.524 m)     Head Circumference --      Peak Flow --      Pain Score 02/15/19 1111 0     Pain Loc --      Pain Edu? --      Excl. in GC? --     Constitutional: Alert and oriented. Well appearing and in no acute distress. Eyes: Conjunctivae are  normal. PERRL. EOMI. Head: Atraumatic. Nose: Bilateral maxillary guarding.   Mouth/Throat: Mucous membranes are moist.  Oropharynx non-erythematous.  Coated tongue and postnasal drainage. Neck: No stridor. Hematological/Lymphatic/Immunilogical: No cervical lymphadenopathy. Cardiovascular: Normal rate, regular rhythm. Grossly normal heart sounds.  Good peripheral circulation. Respiratory: Normal respiratory effort.  No retractions. Lungs CTAB. Neurologic:  Normal speech and language. No gross focal neurologic deficits are appreciated. No gait instability. Skin:  Skin is warm, dry and intact. No rash noted. Psychiatric: Mood and affect are normal. Speech and behavior are normal.  ____________________________________________   LABS (all labs ordered are listed, but only abnormal results are displayed)  Labs Reviewed  GROUP A STREP BY PCR  SARS CORONAVIRUS 2 (TAT 6-24 HRS)  INFLUENZA PANEL BY PCR (TYPE A & B)   ____________________________________________  EKG   ____________________________________________  RADIOLOGY  ED MD interpretation:    Official radiology report(s): DG Chest Portable 1 View  Result Date: 02/15/2019 CLINICAL DATA:  Cough and congestion EXAM: PORTABLE CHEST 1 VIEW COMPARISON:  None. FINDINGS: Lungs are clear. Heart size and pulmonary vascularity are normal. No adenopathy. No bone lesions. IMPRESSION: Lungs clear.  No evident adenopathy. Electronically Signed   By: Bretta Bang III M.D.   On: 02/15/2019 12:03    ____________________________________________   PROCEDURES  Procedure(s) performed (including Critical Care):  Procedures   ____________________________________________   INITIAL IMPRESSION / ASSESSMENT AND PLAN / ED COURSE  As part of my medical decision making, I reviewed the following data within the electronic MEDICAL RECORD NUMBER      Patient presents with sinus congestion, cough, body aches, and fatigue for over a week.  Discussed  chest x-ray and lab results with patient.  Patient physical exam consistent with sinusitis.  Patient given discharge care instruction advised take medication as directed.  Patient advised self quarantine pending results of COVID-19 test.   Davy Pique was evaluated in Emergency Department on 02/15/2019 for the symptoms described in the history of present illness. She was evaluated in the context of the global COVID-19 pandemic, which necessitated consideration that the patient might be at risk for infection with the SARS-CoV-2 virus that causes COVID-19. Institutional protocols and algorithms that pertain to the evaluation of patients at risk for COVID-19 are in a state of rapid change based on information released by regulatory bodies including the CDC and federal and state organizations. These policies and algorithms were followed during the patient's care in the ED.       ____________________________________________   FINAL CLINICAL IMPRESSION(S) / ED DIAGNOSES  Final diagnoses:  Subacute maxillary sinusitis     ED Discharge Orders         Ordered  amoxicillin (AMOXIL) 500 MG capsule  3 times daily     02/15/19 1253    fexofenadine-pseudoephedrine (ALLEGRA-D) 60-120 MG 12 hr tablet  2 times daily     02/15/19 1253    benzonatate (TESSALON PERLES) 100 MG capsule  3 times daily PRN     02/15/19 1253           Note:  This document was prepared using Dragon voice recognition software and may include unintentional dictation errors.    Sable Feil, PA-C 02/15/19 1255    Duffy Bruce, MD 02/16/19 [phone]

## 2020-05-15 ENCOUNTER — Other Ambulatory Visit: Payer: Self-pay

## 2020-05-15 ENCOUNTER — Emergency Department
Admission: EM | Admit: 2020-05-15 | Discharge: 2020-05-15 | Disposition: A | Discharge status: Home / Self Care | Payer: Self-pay | Attending: Emergency Medicine | Admitting: Emergency Medicine

## 2020-05-15 ENCOUNTER — Emergency Department: Payer: Self-pay

## 2020-05-15 DIAGNOSIS — J029 Acute pharyngitis, unspecified: Secondary | ICD-10-CM | POA: Insufficient documentation

## 2020-05-15 DIAGNOSIS — J40 Bronchitis, not specified as acute or chronic: Secondary | ICD-10-CM | POA: Insufficient documentation

## 2020-05-15 DIAGNOSIS — Z87891 Personal history of nicotine dependence: Secondary | ICD-10-CM | POA: Insufficient documentation

## 2020-05-15 MED ORDER — DM-GUAIFENESIN ER 30-600 MG PO TB12: 1.0000 | ORAL_TABLET | Freq: Two times a day (BID) | ORAL | 0 refills | Status: AC

## 2020-05-15 MED ORDER — BENZONATATE 100 MG PO CAPS
100.0000 mg | ORAL_CAPSULE | Freq: Three times a day (TID) | ORAL | 0 refills | Status: AC | PRN
Start: 2020-05-15 — End: 2020-05-22

## 2020-05-15 MED ORDER — AZITHROMYCIN 250 MG PO TABS: ORAL_TABLET | ORAL | 0 refills | Status: AC

## 2020-05-15 MED ORDER — PREDNISONE 50 MG PO TABS: ORAL_TABLET | ORAL | 0 refills | Status: DC

## 2020-05-15 NOTE — ED Provider Notes (Signed)
[facility]-EMERGENCY DEPARTMENT  ____________________________________________  Time seen: Approximately 10:52 PM  I have reviewed the triage vital signs and the nursing notes.   HISTORY  Chief Complaint Cough (Fever) and Fever   Historian Patient     HPI Angela Mcgrath is a 25 y.o. female presents to the emergency department with nonproductive cough for a week and low-grade fever that started today.  Patient states that she is also had some pharyngitis.  She denies vomiting or diarrhea.  No increased work of breathing at home.  No rash.  Patient denies sick contacts in the home with similar symptoms.  No chest pain or chest tightness.  No other alleviating measures have been attempted.   Past Medical History:  Diagnosis Date  . Anxiety   . Depression   . Family history of Downs syndrome    FOB-brother deceased  . IBS (irritable bowel syndrome)   . Increased BMI      Immunizations up to date:  Yes.     Past Medical History:  Diagnosis Date  . Anxiety   . Depression   . Family history of Downs syndrome    FOB-brother deceased  . IBS (irritable bowel syndrome)   . Increased BMI     Patient Active Problem List   Diagnosis Date Noted  . Dysmenorrhea 07/16/2015  . Vaginitis 10/03/2014    Past Surgical History:  Procedure Laterality Date  . NO PAST SURGERIES    . none      Prior to Admission medications   Medication Sig Start Date End Date Taking? Authorizing Provider  azithromycin (ZITHROMAX Z-PAK) 250 MG tablet Take 2 tablets (500 mg) on  Day 1,  followed by 1 tablet (250 mg) once daily on Days 2 through 5. 05/15/20 05/20/20 Yes Pia Mau M, PA-C  benzonatate (TESSALON PERLES) 100 MG capsule Take 1 capsule (100 mg total) by mouth 3 (three) times daily as needed for up to 7 days for cough. 05/15/20 05/22/20 Yes Pia Mau M, PA-C  dextromethorphan-guaiFENesin (MUCINEX DM) 30-600 MG 12hr tablet Take 1 tablet by mouth 2 (two) times daily for 7 days. 05/15/20  05/22/20 Yes Pia Mau M, PA-C  predniSONE (DELTASONE) 50 MG tablet Take one tablet once daily for five days. 05/15/20  Yes Pia Mau M, PA-C  acetaminophen (TYLENOL) 650 MG CR tablet Take 650 mg by mouth every 8 (eight) hours as needed for pain.    [provider]  amoxicillin (AMOXIL) 500 MG capsule Take 1 capsule (500 mg total) by mouth 3 (three) times daily. 02/15/19   Joni Reining, PA-C  fexofenadine-pseudoephedrine (ALLEGRA-D) 60-120 MG 12 hr tablet Take 1 tablet by mouth 2 (two) times daily. 02/15/19   Joni Reining, PA-C  ibuprofen (ADVIL,MOTRIN) 600 MG tablet Take 1 tablet (600 mg total) by mouth every 6 (six) hours. 01/10/18   Doreene Burke, CNM  magic mouthwash w/lidocaine SOLN Take 5 mLs by mouth 4 (four) times daily. 5 mL for swish and swallow 3 times daily or before meals. 04/06/18   Joni Reining, PA-C  norgestimate-ethinyl estradiol (ORTHO-CYCLEN,SPRINTEC,PREVIFEM) 0.25-35 MG-MCG tablet Take 1 tablet by mouth daily. 10/20/17   Gunnar Bulla, CNM    Allergies Bee venom  Family History  Problem Relation Age of Onset  . Heart attack Mother        21's  . Diabetes Mother   . COPD Father   . Asthma Father   . Diabetes Maternal Grandmother   . Heart disease Maternal Grandmother   .  Diabetes Maternal Grandfather   . Colon cancer Neg Hx   . Ovarian cancer Neg Hx   . Breast cancer Neg Hx     Social History Social History   Tobacco Use  . Smoking status: Former Games developer  . Smokeless tobacco: Never Used  Vaping Use  . Vaping Use: Never used  Substance Use Topics  . Alcohol use: No    Alcohol/week: 0.0 standard drinks  . Drug use: No      Review of Systems  Constitutional: Patient has fever.  Eyes: No visual changes. No discharge ENT: Patient has congestion.  Cardiovascular: no chest pain. Respiratory: Patient has cough.  Gastrointestinal: No abdominal pain.  No nausea, no vomiting. No diarrhea.  Genitourinary: Negative for dysuria. No  hematuria Musculoskeletal: Patient has myalgias.  Skin: Negative for rash, abrasions, lacerations, ecchymosis. Neurological: Patient has headache, no focal weakness or numbness.     ____________________________________________   PHYSICAL EXAM:  VITAL SIGNS: ED Triage Vitals  Enc Vitals Group     BP 05/15/20 2205 (!) 121/91     Pulse Rate 05/15/20 2205 (!) 113     Resp 05/15/20 2205 20     Temp 05/15/20 2205 99.3 F (37.4 C)     Temp Source 05/15/20 2205 Oral     SpO2 05/15/20 2205 98 %     Weight 05/15/20 2208 200 lb (90.7 kg)     Height 05/15/20 2208 5' (1.524 m)     Head Circumference --      Peak Flow --      Pain Score 05/15/20 2208 2     Pain Loc --      Pain Edu? --      Excl. in GC? --      Constitutional: Alert and oriented. Patient is lying supine. Eyes: Conjunctivae are normal. PERRL. EOMI. Head: Atraumatic. ENT:      Ears: Tympanic membranes are mildly injected with mild effusion bilaterally.       Nose: No congestion/rhinnorhea.      Mouth/Throat: Mucous membranes are moist. Posterior pharynx is mildly erythematous.  Hematological/Lymphatic/Immunilogical: No cervical lymphadenopathy.  Cardiovascular: Normal rate, regular rhythm. Normal S1 and S2.  Good peripheral circulation. Respiratory: Normal respiratory effort without tachypnea or retractions. Lungs CTAB. Good air entry to the bases with no decreased or absent breath sounds. Gastrointestinal: Bowel sounds 4 quadrants. Soft and nontender to palpation. No guarding or rigidity. No palpable masses. No distention. No CVA tenderness. Musculoskeletal: Full range of motion to all extremities. No gross deformities appreciated. Neurologic:  Normal speech and language. No gross focal neurologic deficits are appreciated.  Skin:  Skin is warm, dry and intact. No rash noted. Psychiatric: Mood and affect are normal. Speech and behavior are normal. Patient exhibits appropriate insight and  judgement.    ____________________________________________   LABS (all labs ordered are listed, but only abnormal results are displayed)  Labs Reviewed - No data to display ____________________________________________  EKG   ____________________________________________  RADIOLOGY Geraldo Pitter, personally viewed and evaluated these images (plain radiographs) as part of my medical decision making, as well as reviewing the written report by the radiologist.  DG Chest 2 View  Result Date: 05/15/2020 CLINICAL DATA:  Fever and left-sided throat pain. EXAM: CHEST - 2 VIEW COMPARISON:  February 15, 2019 FINDINGS: The heart size and mediastinal contours are within normal limits. Both lungs are clear. The visualized skeletal structures are unremarkable. IMPRESSION: No active cardiopulmonary disease. Electronically Signed   By: Waylan Rocher  [place] M.D.   On: 05/15/2020 22:32    ____________________________________________    PROCEDURES  Procedure(s) performed:     Procedures     Medications - No data to display   ____________________________________________   INITIAL IMPRESSION / ASSESSMENT AND PLAN / ED COURSE  Pertinent labs & imaging results that were available during my care of the patient were reviewed by me and considered in my medical decision making (see chart for details).      Assessment and Plan:  Cough Fever 25 year old female presents to the emergency department with cough for 1 week and fever for 1 day.  Chest x-ray shows no consolidations, opacities or infiltrate.  Will treat with azithromycin and prednisone and Tessalon Perles and Mucinex DM for cough at night.  Tylenol and ibuprofen alternating were recommended for fever.  Return precautions were given to return with new or worsening symptoms.     ____________________________________________  FINAL CLINICAL IMPRESSION(S) / ED DIAGNOSES  Final diagnoses:  Bronchitis      NEW MEDICATIONS  STARTED DURING THIS VISIT:  ED Discharge Orders         Ordered    predniSONE (DELTASONE) 50 MG tablet        05/15/20 2242    azithromycin (ZITHROMAX Z-PAK) 250 MG tablet        05/15/20 2242    dextromethorphan-guaiFENesin (MUCINEX DM) 30-600 MG 12hr tablet  2 times daily        05/15/20 2243    benzonatate (TESSALON PERLES) 100 MG capsule  3 times daily PRN        05/15/20 2243              This chart was dictated using voice recognition software/Dragon. Despite best efforts to proofread, errors can occur which can change the meaning. Any change was purely unintentional.     Gasper Lloyd 05/15/20 2259    Chesley Noon, MD 05/15/20 [phone]

## 2020-05-15 NOTE — Discharge Instructions (Signed)
Take tapered prednisone and azithromycin as directed. Take Mucinex and Tessalon Perles for cough.

## 2020-05-15 NOTE — ED Triage Notes (Signed)
Cough and congestion x 3 wks without relief from OTC medications. Reports noted fever today of 100. Pt did not take tylenol or motrin pta. Reports L sided throat pain as well without difficulty swallowing. Pt speaking in full sentences and swallowing secretions with ease.

## 2020-10-17 IMAGING — DX DG CHEST 1V PORT
1 series · 1 of 1 positions shown · non-contrast
Comparison: None.

CLINICAL DATA: Cough and congestion

EXAM:
PORTABLE CHEST 1 VIEW

[chest ap]
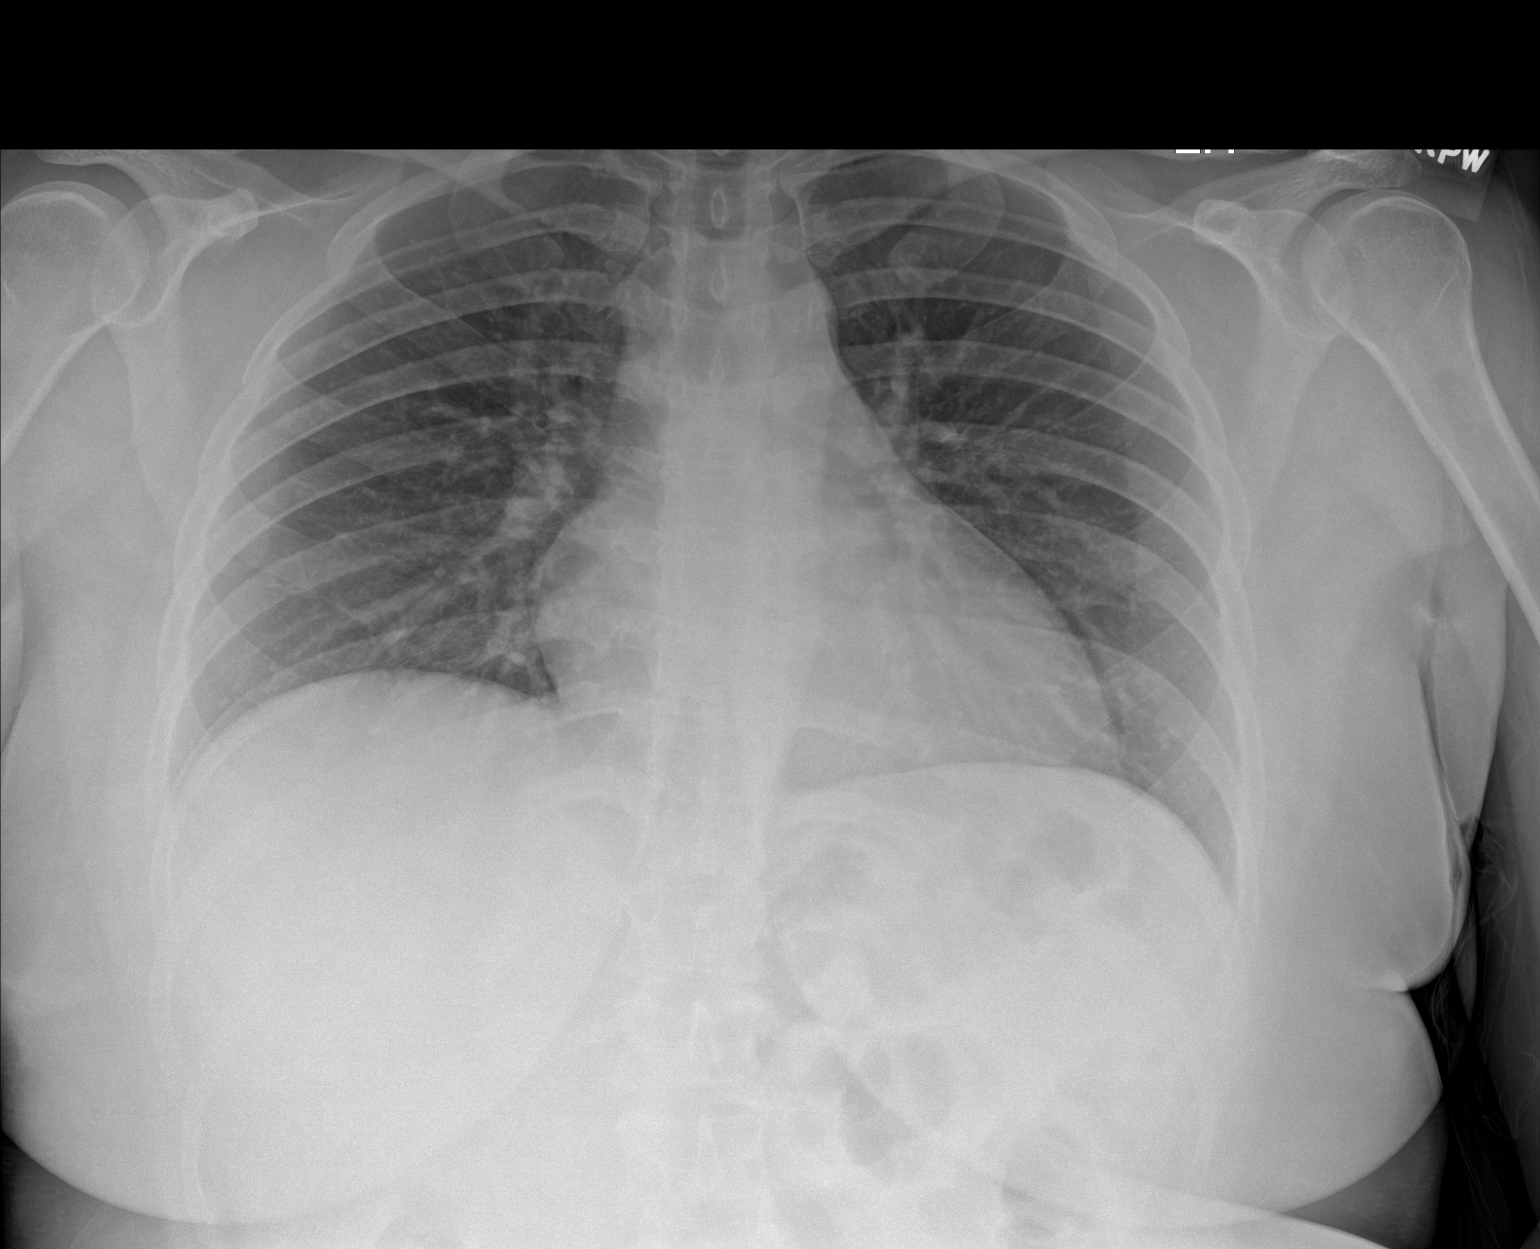

[1 of 1 positions shown; findings below may reference images not displayed]

FINDINGS: Lungs are clear. Heart size and pulmonary vascularity are normal. No
adenopathy. No bone lesions.
IMPRESSION: Lungs clear.  No evident adenopathy.

## 2020-12-21 ENCOUNTER — Other Ambulatory Visit: Payer: Self-pay

## 2020-12-21 DIAGNOSIS — Z87891 Personal history of nicotine dependence: Secondary | ICD-10-CM | POA: Insufficient documentation

## 2020-12-21 DIAGNOSIS — Z20822 Contact with and (suspected) exposure to covid-19: Secondary | ICD-10-CM | POA: Insufficient documentation

## 2020-12-21 DIAGNOSIS — J101 Influenza due to other identified influenza virus with other respiratory manifestations: Secondary | ICD-10-CM | POA: Insufficient documentation

## 2020-12-21 NOTE — ED Triage Notes (Signed)
Pt presents to ER c/o fever and congestion.  Pt states she started feeling sick yesterday morning and today it has progressed.  Pt states she feels sob at this time d/t being congested.  Pt in NAD at this time.  Pt states she had fever of 102 at home and last took tylenol at 0800 today.  Pt denies sick exposure at this time.

## 2020-12-22 ENCOUNTER — Emergency Department
Admission: EM | Admit: 2020-12-22 | Discharge: 2020-12-22 | Disposition: A | Discharge status: Home / Self Care | Payer: Medicaid Other | Attending: Emergency Medicine | Admitting: Emergency Medicine

## 2020-12-22 DIAGNOSIS — J111 Influenza due to unidentified influenza virus with other respiratory manifestations: Secondary | ICD-10-CM

## 2020-12-22 LAB — RESP PANEL BY RT-PCR (FLU A&B, COVID) ARPGX2
Influenza A by PCR: POSITIVE — AB
Influenza B by PCR: NEGATIVE
SARS Coronavirus 2 by RT PCR: NEGATIVE

## 2020-12-22 LAB — POC URINE PREG, ED: Preg Test, Ur: NEGATIVE

## 2020-12-22 MED ORDER — BENZONATATE 100 MG PO CAPS: 100.0000 mg | ORAL_CAPSULE | Freq: Three times a day (TID) | ORAL | 0 refills | Status: DC | PRN

## 2020-12-22 MED ORDER — ONDANSETRON 4 MG PO TBDP: 4.0000 mg | ORAL_TABLET | Freq: Three times a day (TID) | ORAL | 0 refills | Status: DC | PRN

## 2020-12-22 MED ORDER — OSELTAMIVIR PHOSPHATE 75 MG PO CAPS: 75.0000 mg | ORAL_CAPSULE | Freq: Two times a day (BID) | ORAL | 0 refills | Status: AC

## 2020-12-22 NOTE — ED Provider Notes (Signed)
[facility] Emergency Department Provider Note  ____________________________________________   Event Date/Time   First MD Initiated Contact with Patient 12/22/20 [phone]     (approximate)  I have reviewed the triage vital signs    HISTORY  Chief Complaint Fever and Nasal Congestion    HPI Angela Mcgrath is a 25 y.o. female who presents with viral symptoms. Reports symptoms started Saturday morning, congested, tired, some mild sob with the congestion. Then developed fever, constant, better with ibuprofen 800mg  at 130pm.      Past Medical History:  Diagnosis Date   Anxiety    Depression    Family history of Downs syndrome    FOB-brother deceased   IBS (irritable bowel syndrome)    Increased BMI     Patient Active Problem List   Diagnosis Date Noted   Dysmenorrhea 07/16/2015   Vaginitis 10/03/2014    Past Surgical History:  Procedure Laterality Date   NO PAST SURGERIES     none      Prior to Admission medications   Medication Sig Start Date End Date Taking? Authorizing Provider  acetaminophen (TYLENOL) 650 MG CR tablet Take 650 mg by mouth every 8 (eight) hours as needed for pain.    [provider]  amoxicillin (AMOXIL) 500 MG capsule Take 1 capsule (500 mg total) by mouth 3 (three) times daily. 02/15/19   04/15/19, PA-C  fexofenadine-pseudoephedrine (ALLEGRA-D) 60-120 MG 12 hr tablet Take 1 tablet by mouth 2 (two) times daily. 02/15/19   04/15/19, PA-C  ibuprofen (ADVIL,MOTRIN) 600 MG tablet Take 1 tablet (600 mg total) by mouth every 6 (six) hours. 01/10/18   14/3/19, CNM  magic mouthwash w/lidocaine SOLN Take 5 mLs by mouth 4 (four) times daily. 5 mL for swish and swallow 3 times daily or before meals. 04/06/18   04/08/18, PA-C  norgestimate-ethinyl estradiol (ORTHO-CYCLEN,SPRINTEC,PREVIFEM) 0.25-35 MG-MCG tablet Take 1 tablet by mouth daily. 10/20/17   [place], 12/20/17, CNM  predniSONE (DELTASONE)  50 MG tablet Take one tablet once daily for five days. 05/15/20   07/15/20, PA-C    Allergies Bee venom  Family History  Problem Relation Age of Onset   Heart attack Mother        52's   Diabetes Mother    COPD Father    Asthma Father    Diabetes Maternal Grandmother    Heart disease Maternal Grandmother    Diabetes Maternal Grandfather    Colon cancer Neg Hx    Ovarian cancer Neg Hx    Breast cancer Neg Hx     Social History Social History   Tobacco Use   Smoking status: Former   Smokeless tobacco: Never  43's Use: Never used  Substance Use Topics   Alcohol use: No    Alcohol/week: 0.0 standard drinks   Drug use: No      Review of Systems Constitutional: fever  Eyes: No visual changes. ENT: No sore throat. Congestion  Cardiovascular: Denies chest pain. Respiratory: mild sob  Gastrointestinal: No abdominal pain.  + nausea  no vomiting.  No diarrhea.  No constipation. Genitourinary: Negative for dysuria. Musculoskeletal: Negative for back pain. Skin: Negative for rash. Neurological: Negative for headaches, focal weakness or numbness. All other ROS negative ____________________________________________   PHYSICAL EXAM:  VITAL SIGNS: ED Triage Vitals [12/21/20 2321]  Enc Vitals Group     BP (!) 133/97     Pulse Rate 12/23/20)  127     Resp 18     Temp 99.1 F (37.3 C)     Temp Source Oral     SpO2 98 %     Weight      Height      Head Circumference      Peak Flow      Pain Score 0     Pain Loc      Pain Edu?      Excl. in GC?     Constitutional: Alert and oriented. Well appearing and in no acute distress. Eyes: Conjunctivae are normal. EOMI. Head: Atraumatic. Nose: No congestion/rhinnorhea. Mouth/Throat: Mucous membranes are moist.   Neck: No stridor. Trachea Midline. FROM Cardiovascular: slightly tachy, regular rhythm. Good peripheral circulation. Respiratory: no audible stridor, no increased work of breathing   Gastrointestinal: Soft and nontender. No distention.  Musculoskeletal: No lower extremity tenderness nor edema.  No joint effusions. Neurologic:  Normal speech and language. No gross focal neurologic deficits are appreciated.  Skin:  Skin is warm, dry and intact. No rash noted. Psychiatric: Mood and affect are normal. Speech and behavior are normal. GU: Deferred   ____________________________________________   LABS (all labs ordered are listed, but only abnormal results are displayed)  Labs Reviewed  RESP PANEL BY RT-PCR (FLU A&B, COVID) ARPGX2 - Abnormal; Notable for the following components:      Result Value   Influenza A by PCR POSITIVE (*)    All other components within normal limits   ____________________________________________     PROCEDURES  Procedure(s) performed (including Critical Care):  Procedures   ____________________________________________   INITIAL IMPRESSION / ASSESSMENT AND PLAN / ED COURSE  Angela Mcgrath was evaluated in Emergency Department on 12/22/2020 for the symptoms described in the history of present illness. She was evaluated in the context of the global COVID-19 pandemic, which necessitated consideration that the patient might be at risk for infection with the SARS-CoV-2 virus that causes COVID-19. Institutional protocols and algorithms that pertain to the evaluation of patients at risk for COVID-19 are in a state of rapid change based on information released by regulatory bodies including the CDC and federal and state organizations. These policies and algorithms were followed during the patient's care in the ED.     Pt presents with multiple symptoms.  Suspect flu vs covid  Pt very well appearing. Well hydrated on exam, low suspicion for electrolyte abnormalities or AKI.  Slightly tachycardic but is tolerating p.o.  Pt not hypoxic and breathing well therefore does not require admission to hospital.  Lungs clear doubt pna will hold off on xray.      Discussed symptoms management with tylenol and ibuprofen.   Pt young healthy no kidney function for 3 years but normal then.  Patient is just around the 48 hours and would like to try the Tamiflu.  We discussed the risk of it.  Also prescribe some Zofran and cough drops.      ____________________________________________   FINAL CLINICAL IMPRESSION(S) / ED DIAGNOSES   Final diagnoses:  Flu      MEDICATIONS GIVEN DURING THIS VISIT:  Medications - No data to display   ED Discharge Orders          Ordered    oseltamivir (TAMIFLU) 75 MG capsule  2 times daily        12/22/20 0520    ondansetron (ZOFRAN ODT) 4 MG disintegrating tablet  Every 8 hours PRN  12/22/20 0520    benzonatate (TESSALON PERLES) 100 MG capsule  3 times daily PRN        12/22/20 0520             Note:  This document was prepared using Dragon voice recognition software and may include unintentional dictation errors.   Concha Se, MD 12/22/20 [phone]

## 2020-12-22 NOTE — Discharge Instructions (Addendum)
Tylenol 1g very 8 hours Ibuprofen 600-800mg  every 6-8 hours with food.

## 2021-05-31 ENCOUNTER — Emergency Department
Admission: EM | Admit: 2021-05-31 | Discharge: 2021-05-31 | Disposition: A | Discharge status: Home / Self Care | Payer: Self-pay | Attending: Emergency Medicine | Admitting: Emergency Medicine

## 2021-05-31 ENCOUNTER — Other Ambulatory Visit: Payer: Self-pay

## 2021-05-31 ENCOUNTER — Emergency Department: Payer: Self-pay

## 2021-05-31 DIAGNOSIS — R000 Tachycardia, unspecified: Secondary | ICD-10-CM | POA: Insufficient documentation

## 2021-05-31 DIAGNOSIS — J020 Streptococcal pharyngitis: Secondary | ICD-10-CM | POA: Insufficient documentation

## 2021-05-31 LAB — COMPREHENSIVE METABOLIC PANEL
ALT: 14 U/L (ref 0–44)
AST: 26 U/L (ref 15–41)
Albumin: 4 g/dL (ref 3.5–5.0)
Alkaline Phosphatase: 56 U/L (ref 38–126)
Anion gap: 7 (ref 5–15)
BUN: 6 mg/dL (ref 6–20)
CO2: 25 mmol/L (ref 22–32)
Calcium: 9.1 mg/dL (ref 8.9–10.3)
Chloride: 103 mmol/L (ref 98–111)
Creatinine, Ser: 0.86 mg/dL (ref 0.44–1.00)
GFR, Estimated: >60 mL/min (ref >60)
Glucose, Bld: 92 mg/dL (ref 70–99)
Potassium: 4 mmol/L (ref 3.5–5.1)
Sodium: 135 mmol/L (ref 135–145)
Total Bilirubin: 0.9 mg/dL (ref 0.3–1.2)
Total Protein: 7.9 g/dL (ref 6.5–8.1)

## 2021-05-31 LAB — CBC WITH DIFFERENTIAL/PLATELET
Abs Immature Granulocytes: 0.04 10*3/uL (ref 0.00–0.07)
Basophils Absolute: 0 10*3/uL (ref 0.0–0.1)
Basophils Relative: 0 %
Eosinophils Absolute: 0.1 10*3/uL (ref 0.0–0.5)
Eosinophils Relative: 0 %
HCT: 46.1 % — ABNORMAL HIGH (ref 36.0–46.0)
Hemoglobin: 14.5 g/dL (ref 12.0–15.0)
Immature Granulocytes: 0 %
Lymphocytes Relative: 16 %
Lymphs Abs: 2.2 10*3/uL (ref 0.7–4.0)
MCH: 27.3 pg (ref 26.0–34.0)
MCHC: 31.5 g/dL (ref 30.0–36.0)
MCV: 86.8 fL (ref 80.0–100.0)
Monocytes Absolute: 0.8 10*3/uL (ref 0.1–1.0)
Monocytes Relative: 6 %
Neutro Abs: 10.8 10*3/uL — ABNORMAL HIGH (ref 1.7–7.7)
Neutrophils Relative %: 78 %
Platelets: 316 10*3/uL (ref 150–400)
RBC: 5.31 MIL/uL — ABNORMAL HIGH (ref 3.87–5.11)
RDW: 13 % (ref 11.5–15.5)
WBC: 13.8 10*3/uL — ABNORMAL HIGH (ref 4.0–10.5)
nRBC: 0 % (ref 0.0–0.2)

## 2021-05-31 LAB — GROUP A STREP BY PCR: Group A Strep by PCR: DETECTED — AB

## 2021-05-31 MED ORDER — DEXAMETHASONE SODIUM PHOSPHATE 10 MG/ML IJ SOLN
10.0000 mg | Freq: Once | INTRAMUSCULAR | Status: AC
  Administered 2021-05-31: 10 mg via INTRAVENOUS
  Filled 2021-05-31: qty 1

## 2021-05-31 MED ORDER — IOHEXOL 300 MG/ML  SOLN
75.0000 mL | Freq: Once | INTRAMUSCULAR | Status: AC | PRN
  Administered 2021-05-31: 75 mL via INTRAVENOUS
  Filled 2021-05-31: qty 75

## 2021-05-31 MED ORDER — SODIUM CHLORIDE 0.9 % IV SOLN
3.0000 g | Freq: Once | INTRAVENOUS | Status: AC
  Administered 2021-05-31: 3 g via INTRAVENOUS
  Filled 2021-05-31: qty 8

## 2021-05-31 MED ORDER — AMOXICILLIN 400 MG/5ML PO SUSR: 500.0000 mg | Freq: Three times a day (TID) | ORAL | 0 refills | Status: AC

## 2021-05-31 NOTE — ED Provider Notes (Signed)
? ?[facility] ?Provider Note ? ?Patient Contact: 8:34 PM (approximate) ? ? ?History  ? ?Sore Throat ? ? ?HPI ? ?Angela Mcgrath is a 26 y.o. female presents to the emergency department with fever and pharyngitis for the past 2 days.  Patient states that she has had difficulty maintaining her secretions at home and has not been eating due to her pain.  She has pain mostly on the left.  No chest pain or abdominal pain. ? ?  ? ? ?Physical Exam  ? ?Triage Vital Signs: ?ED Triage Vitals  ?Enc Vitals Group  ?   BP 05/31/21 1749 123/82  ?   Pulse Rate 05/31/21 1749 (!) 125  ?   Resp 05/31/21 1749 18  ?   Temp 05/31/21 1749 100.1 ?F (37.8 ?C)  ?   Temp Source 05/31/21 1749 Oral  ?   SpO2 05/31/21 1749 97 %  ?   Weight --   ?   Height --   ?   Head Circumference --   ?   Peak Flow --   ?   Pain Score 05/31/21 1747 10  ?   Pain Loc --   ?   Pain Edu? --   ?   Excl. in GC? --   ? ? ?Most recent vital signs: ?Vitals:  ? 05/31/21 1900 05/31/21 1902  ?BP:  126/87  ?Pulse: (!) 112 (!) 107  ?Resp:  19  ?Temp:    ?SpO2: 96% 94%  ? ? ? ?General: Alert and in no acute distress. ?Eyes:  PERRL. EOMI. ?Head: No acute traumatic findings ?ENT: ?     Ears:  ?     Nose: No congestion/rhinnorhea. ?     Mouth/Throat: Mucous membranes are moist.  Posterior pharynx is erythematous with tonsillar exudate bilaterally.  Patient's left tonsil appears larger than right.  Uvula is midline. ?Neck: No stridor. No cervical spine tenderness to palpation. ?Hematological/Lymphatic/Immunilogical: Palpable cervical lymphadenopathy. ?Cardiovascular:  Good peripheral perfusion ?Respiratory: Normal respiratory effort without tachypnea or retractions. Lungs CTAB. Good air entry to the bases with no decreased or absent breath sounds. ?Gastrointestinal: Bowel sounds ?4 quadrants. Soft and nontender to palpation. No guarding or rigidity. No palpable masses. No distention. No CVA tenderness. ?Musculoskeletal: Full range of motion to all  extremities.  ?Neurologic:  No gross focal neurologic deficits are appreciated.  ?Skin:   No rash noted ? ? ?ED Results / Procedures / Treatments  ? ?Labs ?(all labs ordered are listed, but only abnormal results are displayed) ?Labs Reviewed  ?GROUP A STREP BY PCR - Abnormal; Notable for the following components:  ?    Result Value  ? Group A Strep by PCR DETECTED (*)   ? All other components within normal limits  ?CBC WITH DIFFERENTIAL/PLATELET - Abnormal; Notable for the following components:  ? WBC 13.8 (*)   ? RBC 5.31 (*)   ? HCT 46.1 (*)   ? Neutro Abs 10.8 (*)   ? All other components within normal limits  ?COMPREHENSIVE METABOLIC PANEL  ? ? ? ? ? ?RADIOLOGY ? ?I personally viewed and evaluated these images as part of my medical decision making, as well as reviewing the written report by the radiologist. ? ?ED Provider Interpretation: I personally reviewed CT soft tissue neck and agree with radiologist interpretation.  There is no signs of peritonsillar abscess or other acute abnormality. ? ? ?PROCEDURES: ? ?Critical Care performed: No ? ?Procedures ? ? ?MEDICATIONS ORDERED IN ED: ?Medications  ?  dexamethasone (DECADRON) injection 10 mg (10 mg Intravenous Given 05/31/21 1916)  ?Ampicillin-Sulbactam (UNASYN) 3 g in sodium chloride 0.9 % 100 mL IVPB (0 g Intravenous Stopped 05/31/21 1950)  ?iohexol (OMNIPAQUE) 300 MG/ML solution 75 mL (75 mLs Intravenous Contrast Given 05/31/21 1958)  ? ? ? ?IMPRESSION / MDM / ASSESSMENT AND PLAN / ED COURSE  ?I reviewed the triage vital signs and the nursing notes. ?             ?               ?Assessment and plan ?Group A strep pharyngitis ?Differential diagnosis includes, but is not limited to, strep pharyngitis, peritonsillar abscess, tonsillitis ? ?25 year old female presents to the emergency department with pharyngitis and difficulty maintaining her own secretions at home. ? ?She was tachycardic at 125 with low-grade fever at triage. ? ?On exam, patient had muffled voice and  had asymmetrical tonsil with left side being larger than the right. ? ?IV Unasyn was given in the emergency department after patient tested positive for group A strep and she was given 10 mg of IV Decadron.  CT soft tissue neck showed no signs of peritonsillar abscess.  We will treat with amoxicillin 3 times daily for the next 10 days with return precautions given to return with new or worsening symptoms. ?  ? ? ?FINAL CLINICAL IMPRESSION(S) / ED DIAGNOSES  ? ?Final diagnoses:  ?Strep throat  ? ? ? ?Rx / DC Orders  ? ?ED Discharge Orders   ? ?      Ordered  ?  amoxicillin (AMOXIL) 400 MG/5ML suspension  3 times daily       ? 05/31/21 2033  ? ?  ?  ? ?  ? ? ? ?Note:  This document was prepared using Dragon voice recognition software and may include unintentional dictation errors. ?  ?Orvil Feil, [place] ?05/31/21 2037 ? ?  ?Shaune Pollack, MD ?06/04/21 1043 ? ?

## 2021-05-31 NOTE — ED Triage Notes (Signed)
Pt comes with c/o sore throat for two days. Pt states fever at home. Pt states 10/10 pain. ?

## 2021-05-31 NOTE — Discharge Instructions (Addendum)
Take Amoxicillin three times daily for ten days.  

## 2021-06-09 ENCOUNTER — Other Ambulatory Visit (HOSPITAL_COMMUNITY)
Admission: RE | Admit: 2021-06-09 | Discharge: 2021-06-09 | Disposition: A | Discharge status: Home / Self Care | Payer: Medicaid Other | Source: Ambulatory Visit | Attending: Certified Nurse Midwife | Admitting: Certified Nurse Midwife

## 2021-06-09 ENCOUNTER — Ambulatory Visit (INDEPENDENT_AMBULATORY_CARE_PROVIDER_SITE_OTHER): Payer: Medicaid Other | Admitting: Certified Nurse Midwife

## 2021-06-09 ENCOUNTER — Encounter: Payer: Self-pay | Admitting: Certified Nurse Midwife

## 2021-06-09 VITALS — BP 114/80 | HR 97 | Ht 60.0 in | Wt 206.3 lb

## 2021-06-09 DIAGNOSIS — Z13220 Encounter for screening for lipoid disorders: Secondary | ICD-10-CM

## 2021-06-09 DIAGNOSIS — Z01419 Encounter for gynecological examination (general) (routine) without abnormal findings: Secondary | ICD-10-CM

## 2021-06-09 DIAGNOSIS — N898 Other specified noninflammatory disorders of vagina: Secondary | ICD-10-CM | POA: Insufficient documentation

## 2021-06-09 DIAGNOSIS — Z124 Encounter for screening for malignant neoplasm of cervix: Secondary | ICD-10-CM | POA: Diagnosis present

## 2021-06-09 DIAGNOSIS — Z113 Encounter for screening for infections with a predominantly sexual mode of transmission: Secondary | ICD-10-CM

## 2021-06-09 DIAGNOSIS — E669 Obesity, unspecified: Secondary | ICD-10-CM | POA: Insufficient documentation

## 2021-06-09 MED ORDER — FLUCONAZOLE 150 MG PO TABS: 150.0000 mg | ORAL_TABLET | Freq: Every day | ORAL | 0 refills | Status: DC

## 2021-06-09 NOTE — Progress Notes (Signed)
? ? ?GYNECOLOGY ANNUAL PREVENTATIVE CARE ENCOUNTER NOTE ? ?History:    ? Angela Mcgrath is a 26 y.o. G69P2012 female here for a routine annual gynecologic exam.  Current complaints: vaginal itching, pt has been on antibiotics and has white discharge with  vaginal itching.   Denies abnormal vaginal bleeding, discharge, pelvic pain, problems with intercourse or other gynecologic concerns.  ?  ? ?Social ?Relationship: female partner ( FOB) ?Living: with partner and kids ?Work: [employer] ?Exercise: none  ?Smoke/Alcohol/drug use: denies use  ? ?Gynecologic History ?No LMP recorded (lmp unknown). ?Contraception: condoms ?Last Pap: 07/17/2015. Results were: normal  ?Last mammogram: n/a .  ? ? [facility] - 06/09/21 1427   ? ?  ? Pregnancy Intention Screening  ? Does the patient want to become pregnant in the next year? No   ? Does the patient's partner want to become pregnant in the next year? No   ? Would the patient like to discuss contraceptive options today? No   ? ?  ?  ? ?  ? ?The pregnancy intention screening data noted above was reviewed. Potential methods of contraception were discussed. The patient elected to proceed with condoms. ? ? ?Obstetric History ?OB History  ?Gravida Para Term Preterm AB Living  ?3 2 2   1 2   ?SAB IAB Ectopic Multiple Live Births  ?1     0 2  ?  ?# Outcome Date GA Lbr Len/2nd Weight Sex Delivery Anes PTL Lv  ?3 Term 09/21/17 [redacted]w[redacted]d / 00:23 6 lb 6.3 oz (2.9 kg) F Vag-Spont EPI  LIV  ?2 Term 07/15/12    F Vag-Forceps   LIV  ?1 SAB 07/2011        FD  ?  ?Obstetric Comments  ?Vaginal delivery was with forceps  ? ? ?Past Medical History:  ?Diagnosis Date  ? Anxiety   ? Depression   ? Family history of Downs syndrome   ? FOB-brother deceased  ? IBS (irritable bowel syndrome)   ? Increased BMI   ? ? ?Past Surgical History:  ?Procedure Laterality Date  ? NO PAST SURGERIES    ? none    ? ? ?Current Outpatient Medications on File Prior to Visit  ?Medication Sig Dispense Refill  ? acetaminophen  (TYLENOL) 650 MG CR tablet Take 650 mg by mouth every 8 (eight) hours as needed for pain.    ? amoxicillin (AMOXIL) 400 MG/5ML suspension Take 6.3 mLs (500 mg total) by mouth 3 (three) times daily for 10 days. 200 mL 0  ? benzonatate (TESSALON PERLES) 100 MG capsule Take 1 capsule (100 mg total) by mouth 3 (three) times daily as needed for cough. 30 capsule 0  ? fexofenadine-pseudoephedrine (ALLEGRA-D) 60-120 MG 12 hr tablet Take 1 tablet by mouth 2 (two) times daily. 20 tablet 0  ? ibuprofen (ADVIL,MOTRIN) 600 MG tablet Take 1 tablet (600 mg total) by mouth every 6 (six) hours. 30 tablet 0  ? magic mouthwash w/lidocaine SOLN Take 5 mLs by mouth 4 (four) times daily. 5 mL for swish and swallow 3 times daily or before meals. 90 mL 0  ? norgestimate-ethinyl estradiol (ORTHO-CYCLEN,SPRINTEC,PREVIFEM) 0.25-35 MG-MCG tablet Take 1 tablet by mouth daily. 1 Package 11  ? ondansetron (ZOFRAN ODT) 4 MG disintegrating tablet Take 1 tablet (4 mg total) by mouth every 8 (eight) hours as needed for nausea or vomiting. 20 tablet 0  ? predniSONE (DELTASONE) 50 MG tablet Take one tablet once daily for five days. 5 tablet 0  ? ?  No current facility-administered medications on file prior to visit.  ? ? ?Allergies  ?Allergen Reactions  ? Bee Venom Anaphylaxis  ? ? ?Social History:  reports that she has quit smoking. She has never used smokeless tobacco. She reports that she does not drink alcohol and does not use drugs. ? ?Family History  ?Problem Relation Age of Onset  ? Heart attack Mother   ?     78's  ? Diabetes Mother   ? COPD Father   ? Asthma Father   ? Diabetes Maternal Grandmother   ? Heart disease Maternal Grandmother   ? Diabetes Maternal Grandfather   ? Colon cancer Neg Hx   ? Ovarian cancer Neg Hx   ? Breast cancer Neg Hx   ? ? ?The following portions of the patient's history were reviewed and updated as appropriate: allergies, current medications, past family history, past medical history, past social history, past  surgical history and problem list. ? ?Review of Systems ?Pertinent items noted in HPI and remainder of comprehensive ROS otherwise negative. ? ?Physical Exam:  ?BP 114/80   Pulse 97   Ht 5' (1.524 m)   Wt 206 lb 4.8 oz (93.6 kg)   LMP  (LMP Unknown)   BMI 40.29 kg/m?  ?CONSTITUTIONAL: Well-developed, well-nourished female in no acute distress.  ?HENT:  Normocephalic, atraumatic, External right and left ear normal. Oropharynx is clear and moist ?EYES: Conjunctivae and EOM are normal. Pupils are equal, round, and reactive to light. No scleral icterus.  ?NECK: Normal range of motion, supple, no masses.  Normal thyroid.  ?SKIN: Skin is warm and dry. No rash noted. Not diaphoretic. No erythema. No pallor. ?MUSCULOSKELETAL: Normal range of motion. No tenderness.  No cyanosis, clubbing, or edema.  2+ distal pulses. ?NEUROLOGIC: Alert and oriented to person, place, and time. Normal reflexes, muscle tone coordination.  ?PSYCHIATRIC: Normal mood and affect. Normal behavior. Normal judgment and thought content. ?CARDIOVASCULAR: Normal heart rate noted, regular rhythm ?RESPIRATORY: Clear to auscultation bilaterally. Effort and breath sounds normal, no problems with respiration noted. ?BREASTS: Symmetric in size. No masses, tenderness, skin changes, nipple drainage, or lymphadenopathy bilaterally.  ?ABDOMEN: Soft, no distention noted.  No tenderness, rebound or guarding.  ?PELVIC: Normal appearing external genitalia and urethral meatus; normal appearing vaginal mucosa and cervix. Mild redness white  discharge noted.  Mild odor.  Pap smear obtained.  Normal uterine size, no other palpable masses, no uterine or adnexal tenderness.  . ?  ?Assessment and Plan:  ?  1. Well woman exam with routine gynecological exam ? ?Pap: Will follow up results of pap smear and manage accordingly. ?Mammogram : n/a  ?Labs: hep c , Lipid profile , vaginal swab ?Refills/orders: Diflucan ?Referral: none ?Routine preventative health maintenance  measures emphasized. ?Please refer to After Visit Summary for other counseling recommendations.  ?   ? ?Philip Aspen, CNM ?[facility] ?[facility]   ?

## 2021-06-10 LAB — LIPID PANEL
Chol/HDL Ratio: 2.8 ratio (ref 0.0–4.4)
Cholesterol, Total: 135 mg/dL (ref 100–199)
HDL: 48 mg/dL (ref >39)
LDL Chol Calc (NIH): 58 mg/dL (ref 0–99)
Triglycerides: 174 mg/dL — ABNORMAL HIGH (ref 0–149)
VLDL Cholesterol Cal: 29 mg/dL (ref 5–40)

## 2021-06-10 LAB — HEPATITIS C ANTIBODY: Hep C Virus Ab: NONREACTIVE

## 2021-06-11 ENCOUNTER — Encounter: Payer: Self-pay | Admitting: Certified Nurse Midwife

## 2021-06-11 LAB — CERVICOVAGINAL ANCILLARY ONLY
Bacterial Vaginitis (gardnerella): NEGATIVE
Candida Glabrata: NEGATIVE
Candida Vaginitis: POSITIVE — AB
Comment: NEGATIVE
Comment: NEGATIVE
Comment: NEGATIVE

## 2021-06-11 LAB — CYTOLOGY - PAP: Diagnosis: NEGATIVE

## 2021-06-15 ENCOUNTER — Encounter: Payer: Self-pay | Admitting: Certified Nurse Midwife

## 2021-06-15 ENCOUNTER — Other Ambulatory Visit: Payer: Self-pay | Admitting: Certified Nurse Midwife

## 2021-08-24 ENCOUNTER — Emergency Department: Payer: Self-pay

## 2021-08-24 ENCOUNTER — Other Ambulatory Visit: Payer: Self-pay

## 2021-08-24 ENCOUNTER — Emergency Department
Admission: EM | Admit: 2021-08-24 | Discharge: 2021-08-24 | Disposition: A | Discharge status: Home / Self Care | Payer: Self-pay | Attending: Emergency Medicine | Admitting: Emergency Medicine

## 2021-08-24 DIAGNOSIS — X58XXXA Exposure to other specified factors, initial encounter: Secondary | ICD-10-CM | POA: Insufficient documentation

## 2021-08-24 DIAGNOSIS — S39012A Strain of muscle, fascia and tendon of lower back, initial encounter: Secondary | ICD-10-CM | POA: Insufficient documentation

## 2021-08-24 LAB — URINALYSIS, ROUTINE W REFLEX MICROSCOPIC
Bilirubin Urine: NEGATIVE
Glucose, UA: NEGATIVE mg/dL
Hgb urine dipstick: NEGATIVE
Ketones, ur: NEGATIVE mg/dL
Nitrite: NEGATIVE
Protein, ur: NEGATIVE mg/dL
Specific Gravity, Urine: 1.023 (ref 1.005–1.030)
pH: 6 (ref 5.0–8.0)

## 2021-08-24 LAB — POC URINE PREG, ED: Preg Test, Ur: NEGATIVE

## 2021-08-24 MED ORDER — METHOCARBAMOL 500 MG PO TABS: 500.0000 mg | ORAL_TABLET | Freq: Four times a day (QID) | ORAL | 0 refills | Status: DC

## 2021-08-24 MED ORDER — MELOXICAM 15 MG PO TABS: 15.0000 mg | ORAL_TABLET | Freq: Every day | ORAL | 0 refills | Status: DC

## 2021-08-24 NOTE — ED Provider Notes (Signed)
[facility] Provider Note  Patient Contact: 3:43 PM (approximate)   History   Back Pain   HPI  Angela Mcgrath is a 26 y.o. female who presents the emergency department complaining of low back pain.  Symptoms have been ongoing times a month.  Did wake today with worsening back pain.  Denies any dysuria, polyuria, hematuria, vaginal bleeding or discharge.  No direct trauma to the spine.  No bowel or bladder dysfunction, saddle anesthesia, paresthesias.  No medications prior to arrival.  No history of chronic back issues.     Physical Exam   Triage Vital Signs: ED Triage Vitals  Enc Vitals Group     BP 08/24/21 1435 117/89     Pulse Rate 08/24/21 1435 91     Resp 08/24/21 1435 16     Temp 08/24/21 1435 98.1 F (36.7 C)     Temp Source 08/24/21 1435 Oral     SpO2 08/24/21 1435 98 %     Weight 08/24/21 1436 210 lb (95.3 kg)     Height 08/24/21 1436 5' (1.524 m)     Head Circumference --      Peak Flow --      Pain Score 08/24/21 1435 7     Pain Loc --      Pain Edu? --      Excl. in GC? --     Most recent vital signs: Vitals:   08/24/21 1435  BP: 117/89  Pulse: 91  Resp: 16  Temp: 98.1 F (36.7 C)  SpO2: 98%     General: Alert and in no acute distress.  Cardiovascular:  Good peripheral perfusion Respiratory: Normal respiratory effort without tachypnea or retractions. Lungs CTAB.  Gastrointestinal: Bowel sounds 4 quadrants. Soft and nontender to palpation. No guarding or rigidity. No palpable masses. No distention. No CVA tenderness. Musculoskeletal: Full range of motion to all extremities.  Patient with tenderness in the bilateral paraspinal muscle groups with some appreciable spasms on exam.  No significant midline tenderness or palpable abnormality midline.  No extension into the SI joints or sciatic notch.  Dorsalis pedis pulses sensation intact and equal bilateral lower extremities. Neurologic:  No gross focal neurologic deficits are  appreciated.  Skin:   No rash noted Other:   ED Results / Procedures / Treatments   Labs (all labs ordered are listed, but only abnormal results are displayed) Labs Reviewed  URINALYSIS, ROUTINE W REFLEX MICROSCOPIC - Abnormal; Notable for the following components:      Result Value   Color, Urine YELLOW (*)    APPearance HAZY (*)    Leukocytes,Ua TRACE (*)    Bacteria, UA RARE (*)    All other components within normal limits  POC URINE PREG, ED     EKG     RADIOLOGY  I personally viewed, evaluated, and interpreted these images as part of my medical decision making, as well as reviewing the written report by the radiologist.  ED Provider Interpretation: No acute traumatic findings on x-ray of the lumbar spine.  DG Lumbar Spine 2-3 Views  Result Date: 08/24/2021 CLINICAL DATA:  Mid to central low back pain for 1 month EXAM: LUMBAR SPINE - 2-3 VIEW COMPARISON:  04/21/2016 FINDINGS: Frontal and lateral views of the lumbar spine demonstrate 5 non-rib-bearing lumbar type vertebral bodies with minimal left convex scoliosis centered at L3. Otherwise alignment is anatomic. Stable limbus vertebra superior anterior margin of the L3 vertebral body. No acute fractures. Stable mild  disc space narrowing at L5-S1. Remaining disc spaces are well preserved. Sacroiliac joints are normal. IMPRESSION: 1. Mild left convex scoliosis. 2. Stable mild spondylosis at L5-S1. Electronically Signed   By: Sharlet Salina M.D.   On: 08/24/2021 16:58    PROCEDURES:  Critical Care performed: No  Procedures   MEDICATIONS ORDERED IN ED: Medications - No data to display   IMPRESSION / MDM / ASSESSMENT AND PLAN / ED COURSE  I reviewed the triage vital signs and the nursing notes.                              Differential diagnosis includes, but is not limited to, lumbago, sciatica, pyelonephritis, UTI, nephrolithiasis  Patient's presentation is most consistent with acute presentation with potential  threat to life or bodily function.   Patient's diagnosis is consistent with lumbago.  Patient presents emergency department lower back pain.  No direct trauma.  Imaging was reassuring.  Urinalysis did have a few leuks and bacteria but she has no other urinary symptoms.  As such I will not treat with antibiotics.  Anti-inflammatory muscle relaxer for the patient musculoskeletal back pain.  Follow-up primary care as needed..  Patient is given ED precautions to return to the ED for any worsening or new symptoms.       FINAL CLINICAL IMPRESSION(S) / ED DIAGNOSES   Final diagnoses:  Strain of lumbar region, initial encounter     Rx / DC Orders   ED Discharge Orders          Ordered    meloxicam (MOBIC) 15 MG tablet  Daily        08/24/21 1845    methocarbamol (ROBAXIN) 500 MG tablet  4 times daily        08/24/21 1845             Note:  This document was prepared using Dragon voice recognition software and may include unintentional dictation errors.   Racheal Patches, PA-C 08/24/21 1845    Georga Hacking, MD 08/24/21 [phone]

## 2021-08-24 NOTE — ED Triage Notes (Signed)
Pt c/o mid/central back pain for the past month, denies injury, denies urinary sx. States she thought it was related to her mattress at home

## 2022-01-26 ENCOUNTER — Ambulatory Visit: Payer: Medicaid Other

## 2022-01-28 ENCOUNTER — Ambulatory Visit (INDEPENDENT_AMBULATORY_CARE_PROVIDER_SITE_OTHER): Payer: Self-pay

## 2022-01-28 DIAGNOSIS — Z369 Encounter for antenatal screening, unspecified: Secondary | ICD-10-CM

## 2022-01-28 DIAGNOSIS — Z3480 Encounter for supervision of other normal pregnancy, unspecified trimester: Secondary | ICD-10-CM

## 2022-01-28 DIAGNOSIS — Z3689 Encounter for other specified antenatal screening: Secondary | ICD-10-CM

## 2022-01-28 NOTE — Progress Notes (Signed)
New OB Intake  I connected with  Angela Mcgrath on 01/28/22 at  3:15 PM EST by telephone and verified that I am speaking with the correct person using two identifiers. Nurse is located at [facility] and pt is located at home.  I explained I am completing New OB Intake today. We discussed her EDD of 09/09/2022 that is based on LMP of 12/03/2021. Pt is G4/P2012. I reviewed her allergies, medications, Medical/Surgical/OB history, and appropriate screenings. Based on history, this is a/an pregnancy uncomplicated .   Patient Active Problem List   Diagnosis Date Noted   Supervision of other normal pregnancy, antepartum 01/28/2022   Obesity 06/09/2021   Dysmenorrhea 07/16/2015   Vaginitis 10/03/2014    Concerns addressed today None  Delivery Plans:  Plans to deliver at [facility].  Anatomy US Explained first scheduled [place] will be done 1/10th and an anatomy scan will be done at 20 weeks.  Labs Discussed genetic screening with patient. Patient desires genetic testing to be drawn with new OB labs. Discussed possible labs to be drawn at new OB appointment.  COVID Vaccine Patient has had COVID vaccine.   Social Determinants of Health Food Insecurity: expresses food insecurity. Information given on local food banks. Transportation: Patient denies transportation needs. Pt aware to check in to [facility]. Childcare: Discussed no children allowed at ultrasound appointments.   First visit review I reviewed new OB appt with pt. I explained she will have ob bloodwork and pap smear/pelvic exam if indicated. Explained pt will be seen by Dr. Nicholaus Bloom at first visit; encounter routed to appropriate provider.   Loran Senters, [facility] 01/28/2022  3:49 PM

## 2022-01-28 NOTE — Patient Instructions (Signed)
First Trimester of Pregnancy  The first trimester of pregnancy starts on the first day of your last menstrual period until the end of week 12. This is also called months 1 through 3 of pregnancy. Body changes during your first trimester Your body goes through many changes during pregnancy. The changes usually return to normal after your baby is born. Physical changes You may gain or lose weight. Your breasts may grow larger and hurt. The area around your nipples may get darker. Dark spots or blotches may develop on your face. You may have changes in your hair. Health changes You may feel like you might vomit (nauseous), and you may vomit. You may have heartburn. You may have headaches. You may have trouble pooping (constipation). Your gums may bleed. Other changes You may get tired easily. You may pee (urinate) more often. Your menstrual periods will stop. You may not feel hungry. You may want to eat certain kinds of food. You may have changes in your emotions from day to day. You may have more dreams. Follow these instructions at home: Medicines Take over-the-counter and prescription medicines only as told by your doctor. Some medicines are not safe during pregnancy. Take a prenatal vitamin that contains at least 600 micrograms (mcg) of folic acid. Eating and drinking Eat healthy meals that include: Fresh fruits and vegetables. Whole grains. Good sources of protein, such as meat, eggs, or tofu. Low-fat dairy products. Avoid raw meat and unpasteurized juice, milk, and cheese. If you feel like you may vomit, or you vomit: Eat 4 or 5 small meals a day instead of 3 large meals. Try eating a few soda crackers. Drink liquids between meals instead of during meals. You may need to take these actions to prevent or treat trouble pooping: Drink enough fluids to keep your pee (urine) pale yellow. Eat foods that are high in fiber. These include beans, whole grains, and fresh fruits and  vegetables. Limit foods that are high in fat and sugar. These include fried or sweet foods. Activity Exercise only as told by your doctor. Most people can do their usual exercise routine during pregnancy. Stop exercising if you have cramps or pain in your lower belly (abdomen) or low back. Do not exercise if it is too hot or too humid, or if you are in a place of great height (high altitude). Avoid heavy lifting. If you choose to, you may have sex unless your doctor tells you not to. Relieving pain and discomfort Wear a good support bra if your breasts are sore. Rest with your legs raised (elevated) if you have leg cramps or low back pain. If you have bulging veins (varicose veins) in your legs: Wear support hose as told by your doctor. Raise your feet for 15 minutes, 3-4 times a day. Limit salt in your food. Safety Wear your seat belt at all times when you are in a car. Talk with your doctor if someone is hurting you or yelling at you. Talk with your doctor if you are feeling sad or have thoughts of hurting yourself. Lifestyle Do not use hot tubs, steam rooms, or saunas. Do not douche. Do not use tampons or scented sanitary pads. Do not use herbal medicines, illegal drugs, or medicines that are not approved by your doctor. Do not drink alcohol. Do not smoke or use any products that contain nicotine or tobacco. If you need help quitting, ask your doctor. Avoid cat litter boxes and soil that is used by cats. These carry  germs that can cause harm to the baby and can cause a loss of your baby by miscarriage or stillbirth. General instructions Keep all follow-up visits. This is important. Ask for help if you need counseling or if you need help with nutrition. Your doctor can give you advice or tell you where to go for help. Visit your dentist. At home, brush your teeth with a soft toothbrush. Floss gently. Write down your questions. Take them to your prenatal visits. Where to find more  information American Pregnancy Association: americanpregnancy.org American College of Obstetricians and Gynecologists: www.acog.org Office on Women's Health: womenshealth.gov/pregnancy Contact a doctor if: You are dizzy. You have a fever. You have mild cramps or pressure in your lower belly. You have a nagging pain in your belly area. You continue to feel like you may vomit, you vomit, or you have watery poop (diarrhea) for 24 hours or longer. You have a bad-smelling fluid coming from your vagina. You have pain when you pee. You are exposed to a disease that spreads from person to person, such as chickenpox, measles, Zika virus, HIV, or hepatitis. Get help right away if: You have spotting or bleeding from your vagina. You have very bad belly cramping or pain. You have shortness of breath or chest pain. You have any kind of injury, such as from a fall or a car crash. You have new or increased pain, swelling, or redness in an arm or leg. Summary The first trimester of pregnancy starts on the first day of your last menstrual period until the end of week 12 (months 1 through 3). Eat 4 or 5 small meals a day instead of 3 large meals. Do not smoke or use any products that contain nicotine or tobacco. If you need help quitting, ask your doctor. Keep all follow-up visits. This information is not intended to replace advice given to you by your health care provider. Make sure you discuss any questions you have with your health care provider. Document Revised: 07/04/2019 Document Reviewed: 05/10/2019 Elsevier Patient Education  2023 Elsevier Inc. Commonly Asked Questions During Pregnancy  Cats: A parasite can be excreted in cat feces.  To avoid exposure you need to have another person empty the little box.  If you must empty the litter box you will need to wear gloves.  Wash your hands after handling your cat.  This parasite can also be found in raw or undercooked meat so this should also be  avoided.  Colds, Sore Throats, Flu: Please check your medication sheet to see what you can take for symptoms.  If your symptoms are unrelieved by these medications please call the office.  Dental Work: Most any dental work your dentist recommends is permitted.  X-rays should only be taken during the first trimester if absolutely necessary.  Your abdomen should be shielded with a lead apron during all x-rays.  Please notify your provider prior to receiving any x-rays.  Novocaine is fine; gas is not recommended.  If your dentist requires a note from us prior to dental work please call the office and we will provide one for you.  Exercise: Exercise is an important part of staying healthy during your pregnancy.  You may continue most exercises you were accustomed to prior to pregnancy.  Later in your pregnancy you will most likely notice you have difficulty with activities requiring balance like riding a bicycle.  It is important that you listen to your body and avoid activities that put you at a higher   risk of falling.  Adequate rest and staying well hydrated are a must!  If you have questions about the safety of specific activities ask your provider.    Exposure to Children with illness: Try to avoid obvious exposure; report any symptoms to [place] when noted,  If you have chicken pos, red measles or mumps, you should be immune to these diseases.   Please do not take any vaccines while pregnant unless you have checked with your OB provider.  Fetal Movement: After 28 weeks we recommend you do "kick counts" twice daily.  Lie or sit down in a calm quiet environment and count your baby movements "kicks".  You should feel your baby at least 10 times per hour.  If you have not felt 10 kicks within the first hour get up, walk around and have something sweet to eat or drink then repeat for an additional hour.  If count remains less than 10 per hour notify your provider.  Fumigating: Follow your pest control agent's  advice as to how long to stay out of your home.  Ventilate the area well before re-entering.  Hemorrhoids:   Most over-the-counter preparations can be used during pregnancy.  Check your medication to see what is safe to use.  It is important to use a stool softener or fiber in your diet and to drink lots of liquids.  If hemorrhoids seem to be getting worse please call the office.   Hot Tubs:  Hot tubs Jacuzzis and saunas are not recommended while pregnant.  These increase your internal body temperature and should be avoided.  Intercourse:  Sexual intercourse is safe during pregnancy as long as you are comfortable, unless otherwise advised by your provider.  Spotting may occur after intercourse; report any bright red bleeding that is heavier than spotting.  Labor:  If you know that you are in labor, please go to the hospital.  If you are unsure, please call the office and let us help you decide what to do.  Lifting, straining, etc:  If your job requires heavy lifting or straining please check with your provider for any limitations.  Generally, you should not lift items heavier than that you can lift simply with your hands and arms (no back muscles)  Painting:  Paint fumes do not harm your pregnancy, but may make you ill and should be avoided if possible.  Latex or water based paints have less odor than oils.  Use adequate ventilation while painting.  Permanents & Hair Color:  Chemicals in hair dyes are not recommended as they cause increase hair dryness which can increase hair loss during pregnancy.  " Highlighting" and permanents are allowed.  Dye may be absorbed differently and permanents may not hold as well during pregnancy.  Sunbathing:  Use a sunscreen, as skin burns easily during pregnancy.  Drink plenty of fluids; avoid over heating.  Tanning Beds:  Because their possible side effects are still unknown, tanning beds are not recommended.  Ultrasound Scans:  Routine ultrasounds are performed  at approximately 20 weeks.  You will be able to see your baby's general anatomy an if you would like to know the gender this can usually be determined as well.  If it is questionable when you conceived you may also receive an ultrasound early in your pregnancy for dating purposes.  Otherwise ultrasound exams are not routinely performed unless there is a medical necessity.  Although you can request a scan we ask that you pay for it when  conducted because insurance does not cover " patient request" scans.  Work: If your pregnancy proceeds without complications you may work until your due date, unless your physician or employer advises otherwise.  Round Ligament Pain/Pelvic Discomfort:  Sharp, shooting pains not associated with bleeding are fairly common, usually occurring in the second trimester of pregnancy.  They tend to be worse when standing up or when you remain standing for long periods of time.  These are the result of pressure of certain pelvic ligaments called "round ligaments".  Rest, Tylenol and heat seem to be the most effective relief.  As the womb and fetus grow, they rise out of the pelvis and the discomfort improves.  Please notify the office if your pain seems different than that described.  It may represent a more serious condition.  Common Medications Safe in Pregnancy  Acne:      Constipation:  Benzoyl Peroxide     Colace  Clindamycin      Dulcolax Suppository  Topica Erythromycin     Fibercon  Salicylic Acid      Metamucil         Miralax AVOID:        Senakot   Accutane    Cough:  Retin-A       Cough Drops  Tetracycline      Phenergan w/ Codeine if Rx  Minocycline      Robitussin (Plain & DM)  Antibiotics:     Crabs/Lice:  Ceclor       RID  Cephalosporins    AVOID:  E-Mycins      Kwell  Keflex  Macrobid/Macrodantin   Diarrhea:  Penicillin      Kao-Pectate  Zithromax      Imodium AD         PUSH FLUIDS AVOID:       Cipro     Fever:  Tetracycline      Tylenol (Regular  or Extra  Minocycline       Strength)  Levaquin      Extra Strength-Do not          Exceed 8 tabs/24 hrs Caffeine:        <273m/day (equiv. To 1 cup of coffee or  approx. 3 12 oz sodas)         Gas: Cold/Hayfever:       Gas-X  Benadryl      Mylicon  Claritin       Phazyme  **Claritin-D        Chlor-Trimeton    Headaches:  Dimetapp      ASA-Free Excedrin  Drixoral-Non-Drowsy     Cold Compress  Mucinex (Guaifenasin)     Tylenol (Regular or Extra  Sudafed/Sudafed-12 Hour     Strength)  **Sudafed PE Pseudoephedrine   Tylenol Cold & Sinus     Vicks Vapor Rub  Zyrtec  **AVOID if Problems With Blood Pressure         Heartburn: Avoid lying down for at least 1 hour after meals  Aciphex      Maalox     Rash:  Milk of Magnesia     Benadryl    Mylanta       1% Hydrocortisone Cream  Pepcid  Pepcid Complete   Sleep Aids:  Prevacid      Ambien   Prilosec       Benadryl  Rolaids       Chamomile Tea  Tums (Limit 4/day)     Unisom  Tylenol PM         Warm milk-add vanilla or  Hemorrhoids:       Sugar for taste  Anusol/Anusol H.C.  (RX: Analapram 2.5%)  Sugar Substitutes:  Hydrocortisone OTC     Ok in moderation  Preparation H      Tucks        Vaseline lotion applied to tissue with wiping    Herpes:     Throat:  Acyclovir      Oragel  Famvir  Valtrex     Vaccines:         Flu Shot Leg Cramps:       *Gardasil  Benadryl      Hepatitis A         Hepatitis B Nasal Spray:       Pneumovax  Saline Nasal Spray     Polio Booster         Tetanus Nausea:       Tuberculosis test or PPD  Vitamin B6 25 mg TID   AVOID:    Dramamine      *Gardasil  Emetrol       Live Poliovirus  Ginger Root 250 mg QID    MMR (measles, mumps &  High Complex Carbs @ Bedtime    rebella)  Sea Bands-Accupressure    Varicella (Chickenpox)  Unisom 1/2 tab TID     *No known complications           If received before Pain:         Known pregnancy;   Darvocet       Resume series  after  Lortab        Delivery  Percocet    Yeast:   Tramadol      Femstat  Tylenol 3      Gyne-lotrimin  Ultram       Monistat  Vicodin           MISC:         All Sunscreens           Hair Coloring/highlights          Insect Repellant's          (Including DEET)         Mystic Tans

## 2022-02-04 ENCOUNTER — Other Ambulatory Visit: Payer: Self-pay

## 2022-02-04 ENCOUNTER — Emergency Department
Admission: EM | Admit: 2022-02-04 | Discharge: 2022-02-04 | Disposition: A | Discharge status: Home / Self Care | Payer: Medicaid Other | Attending: Emergency Medicine | Admitting: Emergency Medicine

## 2022-02-04 DIAGNOSIS — H1133 Conjunctival hemorrhage, bilateral: Secondary | ICD-10-CM | POA: Insufficient documentation

## 2022-02-04 DIAGNOSIS — O219 Vomiting of pregnancy, unspecified: Secondary | ICD-10-CM | POA: Diagnosis present

## 2022-02-04 DIAGNOSIS — Z3A00 Weeks of gestation of pregnancy not specified: Secondary | ICD-10-CM | POA: Insufficient documentation

## 2022-02-04 MED ORDER — METOCLOPRAMIDE HCL 10 MG PO TABS
10.0000 mg | ORAL_TABLET | Freq: Three times a day (TID) | ORAL | 0 refills | Status: DC | PRN
Start: 2022-02-04 — End: 2022-02-25

## 2022-02-04 MED ORDER — HYPROMELLOSE (GONIOSCOPIC) 2.5 % OP SOLN: 1.0000 [drp] | Freq: Three times a day (TID) | OPHTHALMIC | 12 refills | Status: DC

## 2022-02-04 MED ORDER — DOXYLAMINE-PYRIDOXINE 10-10 MG PO TBEC
2.0000 | DELAYED_RELEASE_TABLET | Freq: Every day | ORAL | 0 refills | Status: DC
Start: 2022-02-04 — End: 2022-02-25

## 2022-02-04 NOTE — ED Notes (Signed)
This RN reviewed paperwork with pt. No further complaints or questions. Pt ambulated to lobby. Signed discharge form placed in med rec box.  

## 2022-02-04 NOTE — ED Provider Notes (Signed)
[facility] Provider Note    Event Date/Time   First MD Initiated Contact with Patient 02/04/22 1128     (approximate)   History   Chief Complaint Eye Pain and Eye Problem   HPI Angela Mcgrath is a 26 y.o. female, history of IBS, anxiety, depression, presents to the emergency department for evaluation of eye problem.  She states that on Christmas she became sick and began throwing up.  She states that when she threw up, she reportedly popped some blood vessels in her eyes.  Since then she has had gradual worsening of the redness in her eyes, as well as mild irritation.  Denies fever/chills, chest pain, shortness of breath, abdominal pain, decreased visual acuity, hearing changes, or vertigo.   History Limitations: No limitations.        Physical Exam  Triage Vital Signs: ED Triage Vitals  Enc Vitals Group     BP 02/04/22 1121 118/84     Pulse Rate 02/04/22 1121 88     Resp 02/04/22 1121 20     Temp 02/04/22 1121 98.3 F (36.8 C)     Temp Source 02/04/22 1121 Oral     SpO2 02/04/22 1121 97 %     Weight 02/04/22 1119 210 lb (95.3 kg)     Height 02/04/22 1119 5' (1.524 m)     Head Circumference --      Peak Flow --      Pain Score 02/04/22 1118 7     Pain Loc --      Pain Edu? --      Excl. in GC? --     Most recent vital signs: Vitals:   02/04/22 1121  BP: 118/84  Pulse: 88  Resp: 20  Temp: 98.3 F (36.8 C)  SpO2: 97%    General: Awake, NAD.  Skin: Warm, dry. No rashes or lesions.  Eyes: PERRL.  Moderate subconjunctival hemorrhages present bilaterally.  No hyphema is present.  No evidence of corneal injuries.  EOMI.  Gross visual acuity intact.  No active bleeding or discharge. CV: Good peripheral perfusion.  Resp: Normal effort.  Abd: Soft, non-tender. No distention.  Neuro: At baseline. No gross neurological deficits.  Musculoskeletal: Normal ROM of all extremities.  Physical Exam    ED Results / Procedures / Treatments   Labs (all labs ordered are listed, but only abnormal results are displayed) Labs Reviewed - No data to display   EKG N/A.    RADIOLOGY  ED Provider Interpretation: N/A.  No results found.  PROCEDURES:  Critical Care performed: N/A.  Procedures    MEDICATIONS ORDERED IN ED: Medications - No data to display   IMPRESSION / MDM / ASSESSMENT AND PLAN / ED COURSE  I reviewed the triage vital signs and the nursing notes.                              Differential diagnosis includes, but is not limited to, subconjunctival hemorrhage, conjunctivitis, scleritis, iritis, corneal abrasion, hyphema, or foreign body.  Assessment/Plan Presentation consistent with subconjunctival hemorrhage.  No signs of hyphema.  No signs of any infectious etiologies.  No evidence of any damage to the cornea.  Her gross visual acuity is intact.  She states that the pain is fairly mild and manageable.  Reassured her of my findings.  Will provide her with a referral to ophthalmology to follow-up with in case her subconjunctival hemorrhages  worsen or fail to improve.  In the meantime, we will provide her with artificial tears for comfort.  She was amenable to this.  Just prior to discharge, she did state that she is also [redacted] weeks pregnant and has been having some nausea/vomiting recently.  She does not currently take any medications for it at this time, but is requesting something to help.  Will provide her with prescriptions for doxylamine/pyridoxine and metoclopramide.  Recommend that she follow with her OB/GYN as needed.  She was agreeable to this.  Will discharge.  Provided the patient with anticipatory guidance, return precautions, and educational material. Encouraged the patient to return to the emergency department at any time if they begin to experience any new or worsening symptoms. Patient expressed understanding and agreed with the plan.   Patient's presentation is most consistent with acute  complicated illness / injury requiring diagnostic workup.       FINAL CLINICAL IMPRESSION(S) / ED DIAGNOSES   Final diagnoses:  Subconjunctival hemorrhage of both eyes  Nausea and vomiting in pregnancy     Rx / DC Orders   ED Discharge Orders          Ordered    hydroxypropyl methylcellulose / hypromellose (ISOPTO TEARS / GONIOVISC) 2.5 % ophthalmic solution  3 times daily        02/04/22 1135    Doxylamine-Pyridoxine 10-10 MG TBEC  Daily at bedtime        02/04/22 1140    metoCLOPramide (REGLAN) 10 MG tablet  Every 8 hours PRN        02/04/22 1140             Note:  This document was prepared using Dragon voice recognition software and may include unintentional dictation errors.   Varney Daily, [place] 02/04/22 2022    Sharyn Creamer, MD 02/05/22 [phone]

## 2022-02-04 NOTE — Discharge Instructions (Addendum)
-  You may use artificial tears as needed for comfort of the eyes.  -If your symptoms worsen over the next week, please follow-up with the ophthalmologist listed in these instructions.  Please have them know that you were seen here in the emergency department.  -In regards to your nausea/vomiting, you may take the doxylamine/pyridoxine as needed for nausea.  If this is ineffective, you may take the metoclopramide.  Please follow-up with your OB/GYN as needed.  -Return to the emergency department anytime if you begin to experience any new or worsening symptoms.

## 2022-02-04 NOTE — ED Triage Notes (Signed)
Pt reports is pregnant and gets sick intermittently. Pt reports on Christmas she got sick and started vomiting and threw up so hard she popped some blood vessels in her eyes. Pt reports still with eye redness and pain

## 2022-02-08 NOTE — L&D Delivery Note (Signed)
Delivery Note  First Stage: Labor onset: 10am Augmentation : cytotec, pitocin, AROM Analgesia /Anesthesia intrapartum: epidural AROM at 1612  Second Stage: Complete dilation at 1800 Onset of pushing at 1805 FHR second stage reassuring  Delivery of a viable female infant 09/04/2022 at 1809 by Donato Schultz, CNM delivery of fetal head in OA position with restitution to ROA. No nuchal cord;  Anterior then posterior shoulders delivered easily with gentle downward traction. Baby placed on mom's chest, and attended to by peds.  Cord double clamped after cessation of pulsation, cut by FOB Cord blood sample collected   Third Stage: Placenta delivered Tomasa Blase intact with 3 VC @ 1814 Placenta disposition: discarded Uterine tone firm / bleeding scant  No laceration identified  Anesthesia for repair: n/a Repair none needed Est. Blood Loss (mL): 50ml  Complications: none  Mom to postpartum.  Baby to Couplet care / Skin to Skin.  Newborn: Birth Weight: 5lb 15oz  Apgar Scores: 8, 9 Feeding planned: formula feeding

## 2022-02-17 ENCOUNTER — Other Ambulatory Visit: Payer: Self-pay | Admitting: Obstetrics & Gynecology

## 2022-02-17 ENCOUNTER — Ambulatory Visit (INDEPENDENT_AMBULATORY_CARE_PROVIDER_SITE_OTHER): Payer: Medicaid Other

## 2022-02-17 ENCOUNTER — Other Ambulatory Visit: Payer: Medicaid Other

## 2022-02-17 ENCOUNTER — Other Ambulatory Visit (HOSPITAL_COMMUNITY)
Admission: RE | Admit: 2022-02-17 | Discharge: 2022-02-17 | Disposition: A | Discharge status: Home / Self Care | Payer: Medicaid Other | Source: Ambulatory Visit | Attending: Obstetrics & Gynecology | Admitting: Obstetrics & Gynecology

## 2022-02-17 DIAGNOSIS — Z3480 Encounter for supervision of other normal pregnancy, unspecified trimester: Secondary | ICD-10-CM

## 2022-02-17 DIAGNOSIS — Z3A10 10 weeks gestation of pregnancy: Secondary | ICD-10-CM | POA: Diagnosis not present

## 2022-02-17 DIAGNOSIS — Z369 Encounter for antenatal screening, unspecified: Secondary | ICD-10-CM | POA: Insufficient documentation

## 2022-02-17 DIAGNOSIS — Z3687 Encounter for antenatal screening for uncertain dates: Secondary | ICD-10-CM

## 2022-02-18 LAB — URINALYSIS, ROUTINE W REFLEX MICROSCOPIC
Glucose, UA: NEGATIVE
Leukocytes,UA: NEGATIVE
Nitrite, UA: NEGATIVE
RBC, UA: NEGATIVE
Specific Gravity, UA: >=1.03 — AB (ref 1.005–1.030)
Urobilinogen, Ur: 1 mg/dL (ref 0.2–1.0)
pH, UA: 5.5 (ref 5.0–7.5)

## 2022-02-18 LAB — CBC/D/PLT+RPR+RH+ABO+RUBIGG...
Antibody Screen: NEGATIVE
Basophils Absolute: 0 10*3/uL (ref 0.0–0.2)
Basos: 0 %
EOS (ABSOLUTE): 0 10*3/uL (ref 0.0–0.4)
Eos: 0 %
HCV Ab: NONREACTIVE
HIV Screen 4th Generation wRfx: NONREACTIVE
Hematocrit: 42.2 % (ref 34.0–46.6)
Hemoglobin: 13.6 g/dL (ref 11.1–15.9)
Hepatitis B Surface Ag: NEGATIVE
Immature Grans (Abs): 0 10*3/uL (ref 0.0–0.1)
Immature Granulocytes: 0 %
Lymphocytes Absolute: 2.6 10*3/uL (ref 0.7–3.1)
Lymphs: 25 %
MCH: 28.3 pg (ref 26.6–33.0)
MCHC: 32.2 g/dL (ref 31.5–35.7)
MCV: 88 fL (ref 79–97)
Monocytes Absolute: 0.7 10*3/uL (ref 0.1–0.9)
Monocytes: 7 %
Neutrophils Absolute: 6.9 10*3/uL (ref 1.4–7.0)
Neutrophils: 68 %
Platelets: 252 10*3/uL (ref 150–450)
RBC: 4.81 x10E6/uL (ref 3.77–5.28)
RDW: 12.5 % (ref 11.7–15.4)
RPR Ser Ql: NONREACTIVE
Rh Factor: POSITIVE
Rubella Antibodies, IGG: 1.68 index (ref >0.99)
Varicella zoster IgG: 905 index (ref >165)
WBC: 10.2 10*3/uL (ref 3.4–10.8)

## 2022-02-18 LAB — MONITOR DRUG PROFILE 14(MW)
Amphetamine Scrn, Ur: NEGATIVE ng/mL
BARBITURATE SCREEN URINE: NEGATIVE ng/mL
BENZODIAZEPINE SCREEN, URINE: NEGATIVE ng/mL
Buprenorphine, Urine: NEGATIVE ng/mL
CANNABINOIDS UR QL SCN: NEGATIVE ng/mL
Cocaine (Metab) Scrn, Ur: NEGATIVE ng/mL
Creatinine(Crt), U: 345.3 mg/dL — ABNORMAL HIGH (ref 20.0–300.0)
Fentanyl, Urine: NEGATIVE pg/mL
Meperidine Screen, Urine: NEGATIVE ng/mL
Methadone Screen, Urine: NEGATIVE ng/mL
OXYCODONE+OXYMORPHONE UR QL SCN: NEGATIVE ng/mL
Opiate Scrn, Ur: NEGATIVE ng/mL
Ph of Urine: 5.5 (ref 4.5–8.9)
Phencyclidine Qn, Ur: NEGATIVE ng/mL
Propoxyphene Scrn, Ur: NEGATIVE ng/mL
SPECIFIC GRAVITY: 1.029
Tramadol Screen, Urine: NEGATIVE ng/mL

## 2022-02-18 LAB — NICOTINE SCREEN, URINE: Cotinine Ql Scrn, Ur: NEGATIVE ng/mL

## 2022-02-18 LAB — HCV INTERPRETATION

## 2022-02-19 LAB — URINE CYTOLOGY ANCILLARY ONLY
Chlamydia: NEGATIVE
Comment: NEGATIVE
Comment: NORMAL
Neisseria Gonorrhea: NEGATIVE

## 2022-02-19 LAB — CULTURE, OB URINE

## 2022-02-19 LAB — URINE CULTURE, OB REFLEX

## 2022-02-22 LAB — MATERNIT 21 PLUS CORE, BLOOD
Fetal Fraction: 5
Result (T21): NEGATIVE
Trisomy 13 (Patau syndrome): NEGATIVE
Trisomy 18 (Edwards syndrome): NEGATIVE
Trisomy 21 (Down syndrome): NEGATIVE

## 2022-02-25 ENCOUNTER — Encounter: Payer: Self-pay | Admitting: Obstetrics & Gynecology

## 2022-02-25 ENCOUNTER — Ambulatory Visit (INDEPENDENT_AMBULATORY_CARE_PROVIDER_SITE_OTHER): Payer: Self-pay | Admitting: Obstetrics & Gynecology

## 2022-02-25 VITALS — BP 120/80 | Wt 213.0 lb

## 2022-02-25 DIAGNOSIS — O99210 Obesity complicating pregnancy, unspecified trimester: Secondary | ICD-10-CM | POA: Insufficient documentation

## 2022-02-25 DIAGNOSIS — O99211 Obesity complicating pregnancy, first trimester: Secondary | ICD-10-CM

## 2022-02-25 DIAGNOSIS — E669 Obesity, unspecified: Secondary | ICD-10-CM

## 2022-02-25 DIAGNOSIS — Z3480 Encounter for supervision of other normal pregnancy, unspecified trimester: Secondary | ICD-10-CM

## 2022-02-25 DIAGNOSIS — Z3A12 12 weeks gestation of pregnancy: Secondary | ICD-10-CM

## 2022-02-25 DIAGNOSIS — Z3491 Encounter for supervision of normal pregnancy, unspecified, first trimester: Secondary | ICD-10-CM

## 2022-02-25 LAB — POCT URINALYSIS DIPSTICK OB
Bilirubin, UA: NEGATIVE
Blood, UA: NEGATIVE
Glucose, UA: NEGATIVE
Ketones, UA: NEGATIVE
Leukocytes, UA: NEGATIVE
Nitrite, UA: NEGATIVE
POC,PROTEIN,UA: NEGATIVE
Spec Grav, UA: 1.01 (ref 1.010–1.025)
Urobilinogen, UA: 0.2 E.U./dL
pH, UA: 6 (ref 5.0–8.0)

## 2022-02-25 NOTE — Progress Notes (Addendum)
  Subjective:    Angela Mcgrath is a [id number] at [redacted]w[redacted]d c/w 10 week ultrasound being seen today for her first obstetrical visit.  Her obstetrical history is significant for obesity. Patient does not intend to breast feed. Pregnancy history fully reviewed.  Patient reports  nausea . ( She has gained 3 pounds since 02/04/2022)  Vitals:   02/25/22 1312  BP: 120/80  Weight: 213 lb (96.6 kg)    HISTORY: OB History  Gravida Para Term Preterm AB Living  4 2 2   1 2   SAB IAB Ectopic Multiple Live Births  1     0 2    # Outcome Date GA Lbr Len/2nd Weight Sex Delivery Anes PTL Lv  4 Current           3 Term 09/21/17 [redacted]w[redacted]d / 00:23 6 lb 6.3 oz (2.9 kg) F Vag-Spont EPI N LIV  2 Term 07/15/12 [redacted]w[redacted]d  7 lb 9 oz (3.43 kg) F Vag-Forceps  N LIV  1 SAB 07/2011        FD    Obstetric Comments  Vaginal delivery was with forceps   Past Medical History:  Diagnosis Date   Anxiety    Depression    Family history of Downs syndrome    FOB-brother deceased   IBS (irritable bowel syndrome)    Increased BMI    Past Surgical History:  Procedure Laterality Date   NO PAST SURGERIES     Family History  Problem Relation Age of Onset   Heart attack Mother        60's   Diabetes Mother    Hypertension Father    Stroke Father    COPD Father    Asthma Father    Healthy Sister    ADD / ADHD Brother    Schizophrenia Brother    Diabetes Maternal Grandmother    Heart disease Maternal Grandmother    COPD Maternal Grandfather    Diabetes Maternal Grandfather    Colon cancer Neg Hx    Ovarian cancer Neg Hx    Breast cancer Neg Hx      Exam                                      System:     Skin: normal    Neurologic: normal   Extremities: normal strength, tone, and muscle mass   HEENT PERRLA   Mouth/Teeth mucous membranes moist, pharynx normal without lesions   Neck supple   Cardiovascular: regular rate and rhythm   Respiratory:  appears well, vitals normal, no respiratory distress,  acyanotic, normal RR, ear and throat exam is normal, neck free of mass or lymphadenopathy, chest clear, no wheezing, crepitations, rhonchi, normal symmetric air entry   Abdomen: soft, non-tender; bowel sounds normal; no masses,  no organomegaly    Assessment:    Pregnancy: [id number] Patient Active Problem List   Diagnosis Date Noted   Obesity in pregnancy 02/25/2022   Supervision of other normal pregnancy, antepartum 01/28/2022   Obesity 06/09/2021   Dysmenorrhea 07/16/2015   Vaginitis 10/03/2014        Plan:     Initial labs drawn. Prenatal vitamins. Problem list reviewed and updated. Mat 21 results low risk  Ultrasound discussed; fetal survey: ordered with MFM Start baby asa next week  Follow up in 4 weeks.  Emily Filbert 02/25/2022

## 2022-02-25 NOTE — Addendum Note (Signed)
Addended by: Quintella Baton D on: 02/25/2022 01:50 PM   Modules accepted: Orders

## 2022-02-25 NOTE — Addendum Note (Signed)
Addended by: Emily Filbert on: 02/25/2022 01:53 PM   Modules accepted: Orders

## 2022-02-26 NOTE — Addendum Note (Signed)
Addended by: Meryl Dare on: 02/26/2022 08:36 AM   Modules accepted: Orders

## 2022-03-24 ENCOUNTER — Encounter: Payer: Self-pay | Admitting: Emergency Medicine

## 2022-03-24 ENCOUNTER — Emergency Department
Admission: EM | Admit: 2022-03-24 | Discharge: 2022-03-24 | Disposition: A | Discharge status: Home / Self Care | Payer: Medicaid Other | Attending: Emergency Medicine | Admitting: Emergency Medicine

## 2022-03-24 ENCOUNTER — Telehealth: Payer: Self-pay | Admitting: Obstetrics and Gynecology

## 2022-03-24 ENCOUNTER — Encounter: Payer: Medicaid Other | Admitting: Obstetrics and Gynecology

## 2022-03-24 DIAGNOSIS — R519 Headache, unspecified: Secondary | ICD-10-CM | POA: Diagnosis not present

## 2022-03-24 DIAGNOSIS — Z3A16 16 weeks gestation of pregnancy: Secondary | ICD-10-CM | POA: Insufficient documentation

## 2022-03-24 DIAGNOSIS — O26892 Other specified pregnancy related conditions, second trimester: Secondary | ICD-10-CM | POA: Insufficient documentation

## 2022-03-24 MED ORDER — MAGNESIUM GLUCONATE 500 MG PO TABS
500.0000 mg | ORAL_TABLET | Freq: Once | ORAL | Status: AC
  Administered 2022-03-24: 500 mg via ORAL
  Filled 2022-03-24: qty 1

## 2022-03-24 MED ORDER — ACETAMINOPHEN 500 MG PO TABS
1000.0000 mg | ORAL_TABLET | Freq: Once | ORAL | Status: AC
  Administered 2022-03-24: 1000 mg via ORAL
  Filled 2022-03-24: qty 2

## 2022-03-24 MED ORDER — MAGNESIUM 500 MG PO CAPS: 500.0000 mg | ORAL_CAPSULE | Freq: Every evening | ORAL | 0 refills | Status: AC | PRN

## 2022-03-24 MED ORDER — METOCLOPRAMIDE HCL 10 MG PO TABS
10.0000 mg | ORAL_TABLET | Freq: Once | ORAL | Status: AC
  Administered 2022-03-24: 10 mg via ORAL
  Filled 2022-03-24: qty 1

## 2022-03-24 MED ORDER — METOCLOPRAMIDE HCL 10 MG PO TABS: 10.0000 mg | ORAL_TABLET | Freq: Two times a day (BID) | ORAL | 1 refills | Status: DC | PRN

## 2022-03-24 NOTE — Telephone Encounter (Signed)
Pt had called the after hours nurse complaining of a headache that has lasted for 4 days (since 03/21/2022).  Pt came into the clinic on 03/24/2022 around 9:35.  Pt  was then scheduled with Dr. Marcelline Mates on 03/24/2022 at 10:35.  Initially the pt took the appt, but later came to the window stating that she was going to ER for the headache.  Pt's appt was cancelled.

## 2022-03-24 NOTE — ED Notes (Signed)
Pt reports that she is [redacted] weeks pregnant and has been having a headache for 4 days, uncertain if she has been having a fever, but states that she keeps intermittently feeling hot and cold, states some congestion for awhile, reports bilat temple pain and the base of her head

## 2022-03-24 NOTE — Discharge Instructions (Addendum)
Take the magnesium nightly.  Continue taking Tylenol up to [phone] mg every 4-6 hours (maximum 4000 mg daily).  You may also take the Reglan for headache and for nausea.  Return to the ER for new, worsening, or persistent severe headache, vomiting, neck stiffness, fever, vomiting, weakness, or any other new or worsening symptoms that concern you.

## 2022-03-24 NOTE — ED Provider Notes (Signed)
[facility] Provider Note    Event Date/Time   First MD Initiated Contact with Patient 03/24/22 1015     (approximate)   History   Headache   HPI  Angela Mcgrath is a 27 y.o. female G4P2012 at [redacted] weeks pregnant with history of IBS, anxiety, depression who presents with headache for 4 days, gradual onset, intermittent, bilateral, temporal in location although sometimes rating to the back of the head, and associated with photophobia but not with any nausea or vomiting.  The patient states that she had migraines as a child and this headache is somewhat similar, however she has not had a similar headache in many years.  She denies any fever or neck stiffness.  She states she has been taking Tylenol at home with minimal relief, last took it yesterday.  I reviewed the past medical records.  The patient was most recently seen by OB/GYN on 1/18 for her initial prenatal visit.   Physical Exam   Triage Vital Signs: ED Triage Vitals  Enc Vitals Group     BP 03/24/22 0957 132/87     Pulse Rate 03/24/22 0957 (!) 108     Resp 03/24/22 0957 18     Temp 03/24/22 0957 98.4 F (36.9 C)     Temp Source 03/24/22 0957 Oral     SpO2 03/24/22 0957 97 %     Weight 03/24/22 0957 210 lb (95.3 kg)     Height 03/24/22 0957 5' (1.524 m)     Head Circumference --      Peak Flow --      Pain Score 03/24/22 1002 8     Pain Loc --      Pain Edu? --      Excl. in [place]? --     Most recent vital signs: Vitals:   03/24/22 0957  BP: 132/87  Pulse: (!) 108  Resp: 18  Temp: 98.4 F (36.9 C)  SpO2: 97%     General: Awake, no distress.  CV:  Good peripheral perfusion.  Resp:  Normal effort.  Abd:  No distention.  Other:  Neck supple, full ROM.  No meningeal signs.  Motor intact in all extremities.  No facial droop.  Normal coordination with no ataxia.  No pronator drift.   ED Results / Procedures / Treatments   Labs (all labs ordered are listed, but only abnormal  results are displayed) Labs Reviewed - No data to display   EKG     RADIOLOGY     PROCEDURES:  Critical Care performed: No  Procedures   MEDICATIONS ORDERED IN ED: Medications  acetaminophen (TYLENOL) tablet 1,000 mg (1,000 mg Oral Given 03/24/22 1040)  metoCLOPramide (REGLAN) tablet 10 mg (10 mg Oral Given 03/24/22 1040)  magnesium gluconate (MAGONATE) tablet 500 mg (500 mg Oral Given 03/24/22 1050)     IMPRESSION / MDM / ASSESSMENT AND PLAN / ED COURSE  I reviewed the triage vital signs and the nursing notes.  27 year old female [redacted] weeks pregnant presents with intermittent headache for the last 4 days.  Physical exam is unremarkable for acute findings.  The patient has no meningeal signs or other concerning exam findings.  Differential diagnosis includes, but is not limited to, migraine, tension headache, other benign etiology.  There is no indication for diagnostic workup.  We will treat symptomatically with Tylenol, Reglan, and magnesium.  Patient's presentation is most consistent with acute, uncomplicated illness.  ----------------------------------------- 11:56 AM on 03/24/2022 -----------------------------------------  Headache  is improved.  I have prescribed Reglan and magnesium.  I gave the patient strict return precautions and she expressed understanding.   FINAL CLINICAL IMPRESSION(S) / ED DIAGNOSES   Final diagnoses:  Acute nonintractable headache, unspecified headache type     Rx / DC Orders   ED Discharge Orders          Ordered    metoCLOPramide (REGLAN) 10 MG tablet  2 times daily PRN        03/24/22 1155    Magnesium 500 MG CAPS  At bedtime PRN        03/24/22 1155             Note:  This document was prepared using Dragon voice recognition software and may include unintentional dictation errors.    Arta Silence, MD 03/24/22 1156

## 2022-03-24 NOTE — ED Triage Notes (Signed)
Pt endorses headache x4 days. Pt [redacted] weeks pregnant. No relief with tylenol.

## 2022-03-25 ENCOUNTER — Ambulatory Visit (INDEPENDENT_AMBULATORY_CARE_PROVIDER_SITE_OTHER): Payer: Medicaid Other | Admitting: Licensed Practical Nurse

## 2022-03-25 ENCOUNTER — Encounter: Payer: Self-pay | Admitting: Licensed Practical Nurse

## 2022-03-25 VITALS — BP 115/79 | HR 96 | Wt 210.8 lb

## 2022-03-25 DIAGNOSIS — Z131 Encounter for screening for diabetes mellitus: Secondary | ICD-10-CM

## 2022-03-25 DIAGNOSIS — Z3480 Encounter for supervision of other normal pregnancy, unspecified trimester: Secondary | ICD-10-CM

## 2022-03-25 DIAGNOSIS — Z3482 Encounter for supervision of other normal pregnancy, second trimester: Secondary | ICD-10-CM

## 2022-03-25 DIAGNOSIS — Z3A16 16 weeks gestation of pregnancy: Secondary | ICD-10-CM

## 2022-03-25 LAB — POCT URINALYSIS DIPSTICK
Bilirubin, UA: NEGATIVE
Blood, UA: NEGATIVE
Glucose, UA: NEGATIVE
Ketones, UA: NEGATIVE
Leukocytes, UA: NEGATIVE
Nitrite, UA: NEGATIVE
Protein, UA: NEGATIVE
Spec Grav, UA: 1.02 (ref 1.010–1.025)
Urobilinogen, UA: 1 E.U./dL
pH, UA: 7 (ref 5.0–8.0)

## 2022-03-25 NOTE — Progress Notes (Signed)
Routine Prenatal Care Visit  Subjective  Angela Mcgrath is a 27 y.o. [id number] at 69w0dbeing seen today for ongoing prenatal care.  She is currently monitored for the following issues for this high-risk pregnancy and has Vaginitis; Dysmenorrhea; Obesity; Supervision of other normal pregnancy, antepartum; and Obesity in pregnancy on their problem list.  ----------------------------------------------------------------------------------- Patient reports  has had a HA on and off since Sunday, she was seen it the Ed for it, was give medication and sent home .  The headache is on her temples and back of her head, it comes and goes.  Te medication they gave in the Er helped. Reviewed treatment and when to be concerned. -pt reports her urine is "really yellow" urine amber colored today, reviewed it appears that she is not drinking enough fluids, encouraged pt to increase hydration (this may help the HA too) -has Anatomy [place] March -reviewed early 1 hour d/t BMI, pt will schedule for tomorrow  -Pt reports being under stress, she was recently let go from work but already has interviews lined up.   Contractions: Not present. Vag. Bleeding: None.  Movement: Present. Leaking Fluid denies.  ----------------------------------------------------------------------------------- The following portions of the patient's history were reviewed and updated as appropriate: allergies, current medications, past family history, past medical history, past social history, past surgical history and problem list. Problem list updated.  Objective  Blood pressure 115/79, pulse 96, weight 210 lb 12.8 oz (95.6 kg), last menstrual period 12/03/2021. Pregravid weight 210 lb (95.3 kg) Total Weight Gain 12.8 oz (0.363 kg) Urinalysis: Urine Protein    Urine Glucose    Fetal Status: Fetal Heart Rate (bpm): 140   Movement: Present     General:  Alert, oriented and cooperative. Patient is in no acute distress.  Skin: Skin is warm and  dry. No rash noted.   Cardiovascular: Normal heart rate noted  Respiratory: Normal respiratory effort, no problems with respiration noted  Abdomen: Soft, gravid, appropriate for gestational age. Pain/Pressure: Absent     Pelvic:  Cervical exam deferred        Extremities: Normal range of motion.     Mental Status: Normal mood and affect. Normal behavior. Normal judgment and thought content.   Assessment   27y.o. [id number] 187w0dy  09/09/2022, by Last Menstrual Period presenting for routine prenatal visit  Plan   fourth Problems (from 01/28/22 to present)     Problem Noted Resolved   Supervision of other normal pregnancy, antepartum 01/28/2022 by JoCleophas DunkerCMA No   Overview Addendum 03/25/2022  1:11 PM by DoAllen DerryCNM     Clinical Staff Provider  Office Location   Ob/Gyn Dating  Not found.  Language  English Anatomy [place]  Flu Vaccine  offer Genetic Screen  NIPS:   TDaP vaccine   offer Hgb A1C or  GTT Early : Third trimester :   Covid One booster   LAB RESULTS   Rhogam  O/Positive/-- (01/10 1429)  Blood Type O/Positive/-- (01/10 1429)   Feeding Plan formula Antibody Negative (01/10 1429)  Contraception undecided Rubella 1.68 (01/10 1429)  Circumcision yes RPR Non Reactive (01/10 1429)   Pediatrician  [facility] HBsAg Negative (01/10 1429)   Support Person Dylan, Dylan's mom HIV Non Reactive (01/10 1429)  Prenatal Classes yes Varicella Immune    GBS  (For PCN allergy, check sensitivities)   BTL Consent  Hep C Non Reactive (01/10 1429)   VBAC Consent  Pap Diagnosis  Date Value Ref Range Status  06/09/2021      - Negative for intraepithelial lesion or malignancy (NILM)      Hgb Electro      CF      SMA                   general obstetric precautions including but not limited to vaginal bleeding, contractions, leaking of fluid and fetal movement were reviewed in detail with the patient. Please refer to After Visit Summary for other counseling  recommendations.   Return in about 4 weeks (around 04/22/2022) for ROB, 1 hour gtt tomorrow lab only .  Roberto Scales, [facility]  03/25/22  1:41 PM

## 2022-03-25 NOTE — Patient Instructions (Addendum)
To Prevent Headache: Magnesium 465m daily Riboflavin  406mdaily Co Enzyme Q 10 15056maily  For a bad headache: 2 extra strength Tylenol, 1 Benadryl or Sudafed, 1 can of coke

## 2022-03-26 ENCOUNTER — Other Ambulatory Visit: Payer: Medicaid Other

## 2022-03-26 DIAGNOSIS — Z3480 Encounter for supervision of other normal pregnancy, unspecified trimester: Secondary | ICD-10-CM

## 2022-03-26 DIAGNOSIS — Z131 Encounter for screening for diabetes mellitus: Secondary | ICD-10-CM

## 2022-03-27 LAB — GLUCOSE, 1 HOUR GESTATIONAL: Gestational Diabetes Screen: 130 mg/dL (ref 70–139)

## 2022-04-15 ENCOUNTER — Other Ambulatory Visit: Payer: Medicaid Other

## 2022-04-19 ENCOUNTER — Ambulatory Visit: Admission: RE | Admit: 2022-04-19 | Payer: Medicaid Other | Source: Ambulatory Visit

## 2022-04-22 ENCOUNTER — Encounter: Payer: Medicaid Other | Admitting: Obstetrics and Gynecology

## 2022-07-06 ENCOUNTER — Telehealth: Payer: Self-pay | Admitting: Licensed Clinical Social Worker

## 2022-07-06 NOTE — Telephone Encounter (Signed)
-----   Message from Gustavo Lah, CNM sent at 06/29/2022 10:42 AM EDT ----- Regarding: referral Marchelle Folks,  Are you still taking referrals for counseling?  Sivan is reporting more depression symptoms and desires counseling.  She was interesting in seeing you if you are able to accept her.  Please let me know!  Thank you,  Tobi Bastos

## 2022-07-15 ENCOUNTER — Ambulatory Visit: Payer: Medicaid Other | Admitting: Licensed Clinical Social Worker

## 2022-07-15 DIAGNOSIS — F331 Major depressive disorder, recurrent, moderate: Secondary | ICD-10-CM

## 2022-07-15 DIAGNOSIS — F411 Generalized anxiety disorder: Secondary | ICD-10-CM | POA: Insufficient documentation
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
## 2022-07-29 ENCOUNTER — Ambulatory Visit: Payer: Medicaid Other | Admitting: Licensed Clinical Social Worker

## 2022-07-29 DIAGNOSIS — F411 Generalized anxiety disorder: Secondary | ICD-10-CM

## 2022-07-29 DIAGNOSIS — F331 Major depressive disorder, recurrent, moderate: Secondary | ICD-10-CM

## 2022-07-29 NOTE — Progress Notes (Signed)
Counselor/Therapist Progress Note  Patient ID: Angela Mcgrath, MRN: [id number],    Date: 07/29/2022  Time Spent: 50 minutes   Treatment Type: Psychotherapy  Reported Symptoms:  Some improvement in symptoms  Mental Status Exam:  Appearance:   Casual and Neat     Behavior:  Appropriate, Sharing, and Motivated  Motor:  Normal  Speech/Language:   Clear and Coherent and Normal Rate  Affect:  Appropriate, Congruent, and Full Range  Mood:  normal  Thought process:  normal  Thought content:    WNL  Sensory/Perceptual disturbances:    WNL  Orientation:  oriented to person, place, time/date, situation, and day of week  Attention:  Good  Concentration:  Good  Memory:  WNL  Fund of knowledge:   Good  Insight:    Fair  Judgment:   Fair  Impulse Control:  Fair   Risk Assessment: Danger to Self:  No Self-injurious Behavior: No Danger to Others: No Duty to Warn:no Physical Aggression / Violence:No  Access to Firearms a concern: No  Gang Involvement:No   Subjective: Patient was engaged and cooperative throughout the session using time effectively to discuss thoughts and feelings, treatment plan and goal of treatment. Patient reports that things have been a little better since last visit 2 weeks ago. Patient voices continued motivation for treatment and understanding of  CBTs and scheduled follow up session.     Interventions: Cognitive Behavioral Therapy and Client Centered   Checked in with patient and reviewed previous session, including assessment. LCSW provided psychoeducation on CBTs, explored patient's goal of treatment and continued to build rapport and worked collaboratively to develop treatment plan. LCSW and patient discussed anger and charted out thoughts, emotions and behaviors. Provided support through active listening, validation of feelings, and highlighted patient's strengths.    Diagnosis:   ICD-10-CM   1. Major depressive disorder, recurrent episode, moderate (HCC)   F33.1     2. Generalized anxiety disorder  F41.1      Plan: Patient's goal of treatment is to mentally think better.    Treatment Target: Understand the relationship between thoughts, emotions, and behaviors  Psychoeducation on CBTs model   Oriented the client to the therapeutic approach Teach the connection between thoughts, emotions, and behaviors  Treatment Target: Increase realistic balanced thinking -to learn how to replace thinking with thoughts that are more accurate or helpful Explore patient's thoughts, beliefs, automatic thoughts, assumptions  Identify and replace unhelpful thinking patterns (upsetting ideas, self-talk and mental images) Process distress and allow for emotional release  Questioning and challenging thoughts Cognitive reappraisal  Restructuring, Socratic questioning  Treatment Target: Reducing vulnerability to "emotional mind" Values clarification   Self-care - nutrition, sleep, exercise  Mindfulness skills Treatment Target: Increase emotional regulation  Coping skills to manage mood symptoms - anger   Teach distress tolerance techniques - "what helps me"  Opposite action PLEASE  skills or self-care skills  Self-soothing  Cope ahead skills - imagery, rehearsal, problem-solving, exposure   Interpersonal Effectiveness   Future Appointments  Date Time Provider Department Center  08/05/2022  1:00 PM Kathreen Cosier, LCSW [facility] None    Kathreen Cosier, LCSW

## 2022-08-05 ENCOUNTER — Ambulatory Visit: Payer: Medicaid Other | Admitting: Licensed Clinical Social Worker

## 2022-08-05 DIAGNOSIS — F331 Major depressive disorder, recurrent, moderate: Secondary | ICD-10-CM

## 2022-08-05 DIAGNOSIS — F411 Generalized anxiety disorder: Secondary | ICD-10-CM

## 2022-08-05 NOTE — Progress Notes (Signed)
Counselor/Therapist Progress Note  Patient ID: Angela Mcgrath, MRN: [id number],    Date: 08/05/2022  Time Spent: 37 minutes    Treatment Type: Psychotherapy  Reported Symptoms:  irritability, sleep disturbance, pain   Mental Status Exam:  Appearance:   Casual and Well Groomed     Behavior:  Appropriate, Sharing, and Motivated  Motor:  Normal  Speech/Language:   Clear and Coherent and Normal Rate  Affect:  Appropriate, Congruent, and Full Range  Mood:  normal  Thought process:  normal  Thought content:    WNL  Sensory/Perceptual disturbances:    WNL  Orientation:  oriented to person, place, time/date, and situation  Attention:  Good  Concentration:  Good  Memory:  WNL  Fund of knowledge:   Good  Insight:    Fair  Judgment:   Fair  Impulse Control:  Fair   Risk Assessment: Danger to Self:  No Self-injurious Behavior: No Danger to Others: No Duty to Warn:no Physical Aggression / Violence:No  Access to Firearms a concern: No  Gang Involvement:No   Subjective: Patient was receptive to feedback and intervention from LCSW and actively and effectively participated throughout the session. Patient reports overall her mood has been good, she does also report irritability. Patient is likely to benefit from future treatment because she remains motivated to decrease symptoms and improve functioning.   Interventions: Cognitive Behavioral Therapy and Client Centered  Checked in with patient regarding their week. LCSW explored patient's experiences of irritability, identifying points of intervention. LCSW provided psychoeducation on irritability and anxiety, taught patient about mindfulness meditation and contracted with patient to practice daily.  Provided support through active listening, validation of feelings, and highlighted patient's strengths.   Diagnosis:   ICD-10-CM   1. Major depressive disorder, recurrent episode, moderate (HCC)  F33.1     2. Generalized anxiety disorder   F41.1       Plan:  Patient's goal of treatment is to mentally think better.     Treatment Target: Understand the relationship between thoughts, emotions, and behaviors  Psychoeducation on CBTs model   Oriented the client to the therapeutic approach Teach the connection between thoughts, emotions, and behaviors  Treatment Target: Increase realistic balanced thinking -to learn how to replace thinking with thoughts that are more accurate or helpful Explore patient's thoughts, beliefs, automatic thoughts, assumptions  Identify and replace unhelpful thinking patterns (upsetting ideas, self-talk and mental images) Process distress and allow for emotional release  Questioning and challenging thoughts Cognitive reappraisal  Restructuring, Socratic questioning  Treatment Target: Reducing vulnerability to "emotional mind" Values clarification   Self-care - nutrition, sleep, exercise  Mindfulness skills Treatment Target: Increase emotional regulation  Coping skills to manage mood symptoms - anger   Teach distress tolerance techniques - "what helps me"  Opposite action PLEASE  skills or self-care skills  Self-soothing  Cope ahead skills - imagery, rehearsal, problem-solving, exposure   Interpersonal Effectiveness   Future Appointments  Date Time Provider Department Center  08/19/2022 10:20 AM Kathreen Cosier, LCSW [facility] None    Kathreen Cosier, LCSW

## 2022-08-19 ENCOUNTER — Telehealth: Payer: Self-pay | Admitting: Licensed Clinical Social Worker

## 2022-08-19 ENCOUNTER — Ambulatory Visit: Payer: Medicaid Other | Admitting: Licensed Clinical Social Worker

## 2022-08-19 NOTE — Telephone Encounter (Signed)
LCSW received email from patient requesting to reschedule and asking LCSW to call to reschedule the appt. LCSW attempted to call patient and lvm.

## 2022-08-25 ENCOUNTER — Other Ambulatory Visit: Payer: Self-pay | Admitting: Obstetrics and Gynecology

## 2022-08-25 DIAGNOSIS — Z3490 Encounter for supervision of normal pregnancy, unspecified, unspecified trimester: Secondary | ICD-10-CM

## 2022-08-25 NOTE — Progress Notes (Signed)
Angela Mcgrath is a 27 y.o. [phone] female at [redacted]w[redacted]d dated by LMP.  She presents to L&D for elective IOL.   Pregnancy Issues: BMI 40 or more - Obesity Class III  IUGR - 08/16/22: MFM u/s EFW 13% Circumvallate placenta seen @ anatomy [facility] H/o mental health diagnoses: _Depression__   Prenatal care site: [place] [facility]     Pertinent Results:  Prenatal Labs: Blood type/Rh O pos  Antibody screen neg  Rubella Immune  Varicella Immune  RPR NR  HBsAg Neg  HIV NR  GC neg  Chlamydia neg  Genetic screening negative  1 hour GTT 129  3 hour GTT   GBS Neg    5. Post Partum Planning: - Infant feeding: Formula - Contraception: BTL or IUD - Tdap not done AP   Angela Mcgrath, CNM 08/25/2022 3:45 PM

## 2022-09-02 ENCOUNTER — Ambulatory Visit: Payer: Medicaid Other | Admitting: Licensed Clinical Social Worker

## 2022-09-02 DIAGNOSIS — F331 Major depressive disorder, recurrent, moderate: Secondary | ICD-10-CM

## 2022-09-02 DIAGNOSIS — F411 Generalized anxiety disorder: Secondary | ICD-10-CM

## 2022-09-02 NOTE — Progress Notes (Signed)
Counselor/Therapist Progress Note  Patient ID: Angela Mcgrath, MRN: [id number],    Date: 09/02/2022  Time Spent: 45 minutes    Treatment Type: Psychotherapy  Reported Symptoms:  irritability, mild anxiety, anxious thoughts; intermittent sadness, depressed mood    Mental Status Exam:  Appearance:   Casual     Behavior:  Appropriate, Sharing, and Motivated  Motor:  Normal  Speech/Language:   Clear and Coherent and Normal Rate  Affect:  Appropriate, Congruent, and Full Range  Mood:  normal  Thought process:  normal  Thought content:    WNL  Sensory/Perceptual disturbances:    WNL  Orientation:  oriented to person, place, time/date, situation, and day of week  Attention:  Good  Concentration:  Good  Memory:  WNL  Fund of knowledge:   Good  Insight:    Fair  Judgment:   Fair  Impulse Control:  Fair   Risk Assessment: Danger to Self:  No Self-injurious Behavior: No Danger to Others: No Duty to Warn:no Physical Aggression / Violence:No  Access to Firearms a concern: No  Gang Involvement:No   Subjective: Patient was engaged and cooperative throughout the session using time effectively to discuss thoughts and feelings. She reports that she is feeling mixed feelings about scheduled delivery for this Saturday. Patient reports she is happy to deliver since she has had a lot of physical discomfort. Patient voices continued motivation for treatment and agreed to schedule virtual viit two weeks post delivery.     Interventions: Cognitive Behavioral Therapy and Client Centered  Clinician met with patient to identify needs related to stressors and functioning, and assess and monitor for signs and symptoms of depression and anxiety, and assess safety. The clinician processed with the patient how they have been doing since the last follow-up session. LCSW assisted patient in processing their thoughts and emotions about upcoming delivery, reviewed supports and provided psychoeducation on  postpartum mood and anxiety disorders.     Provide brief psychoeducation on postpartum mood and anxiety disorders signs and symptoms, including information on Baby Blues, Postpartum Depression, and Postpartum Anxiety.   Discussed self-care strategies to prevent and/or reduce symptoms of postpartum mood and anxiety disorders, including sleep hygiene, eating regularly, drinking fluids, getting outside, and support from partner, family, and/or friends.   Informed patient to call her provider right away or get emergency help if she experiences any of the following symptoms: Feelings of hopelessness and total despair. Feeling out of touch with reality (hearing or seeing things other people don't). Feeling that you might hurt yourself or your baby.   Diagnosis:   ICD-10-CM   1. Major depressive disorder, recurrent episode, moderate (HCC)  F33.1     2. Generalized anxiety disorder  F41.1      Plan:  Patient's goal of treatment is to mentally think better.     Treatment Target: Understand the relationship between thoughts, emotions, and behaviors  Psychoeducation on CBTs model   Oriented the client to the therapeutic approach Teach the connection between thoughts, emotions, and behaviors  Treatment Target: Increase realistic balanced thinking -to learn how to replace thinking with thoughts that are more accurate or helpful Explore patient's thoughts, beliefs, automatic thoughts, assumptions  Identify and replace unhelpful thinking patterns (upsetting ideas, self-talk and mental images) Process distress and allow for emotional release  Questioning and challenging thoughts Cognitive reappraisal  Restructuring, Socratic questioning  Treatment Target: Reducing vulnerability to "emotional mind" Values clarification   Self-care - nutrition, sleep, exercise  Mindfulness skills Treatment Target:  Increase emotional regulation  Coping skills to manage mood symptoms - anger   Teach distress  tolerance techniques - "what helps me"  Opposite action PLEASE  skills or self-care skills  Self-soothing  Cope ahead skills - imagery, rehearsal, problem-solving, exposure   Interpersonal Effectiveness   Future Appointments  Date Time Provider Department Center  09/20/2022 11:00 AM Kathreen Cosier, LCSW [facility] None     Kathreen Cosier, LCSW

## 2022-09-04 ENCOUNTER — Other Ambulatory Visit: Payer: Self-pay

## 2022-09-04 ENCOUNTER — Inpatient Hospital Stay: Payer: Medicaid Other | Admitting: Anesthesiology

## 2022-09-04 ENCOUNTER — Encounter: Payer: Self-pay | Admitting: Obstetrics and Gynecology

## 2022-09-04 ENCOUNTER — Inpatient Hospital Stay
Admission: EM | Admit: 2022-09-04 | Discharge: 2022-09-05 | DRG: 807 | Disposition: A | Discharge status: Home / Self Care | Payer: Medicaid Other | Attending: Certified Nurse Midwife | Admitting: Certified Nurse Midwife

## 2022-09-04 DIAGNOSIS — Z3A39 39 weeks gestation of pregnancy: Secondary | ICD-10-CM

## 2022-09-04 DIAGNOSIS — O365930 Maternal care for other known or suspected poor fetal growth, third trimester, not applicable or unspecified: Secondary | ICD-10-CM | POA: Diagnosis present

## 2022-09-04 DIAGNOSIS — Z3490 Encounter for supervision of normal pregnancy, unspecified, unspecified trimester: Secondary | ICD-10-CM

## 2022-09-04 DIAGNOSIS — O26893 Other specified pregnancy related conditions, third trimester: Secondary | ICD-10-CM | POA: Diagnosis present

## 2022-09-04 DIAGNOSIS — Z8249 Family history of ischemic heart disease and other diseases of the circulatory system: Secondary | ICD-10-CM | POA: Diagnosis not present

## 2022-09-04 DIAGNOSIS — O99214 Obesity complicating childbirth: Secondary | ICD-10-CM | POA: Diagnosis present

## 2022-09-04 DIAGNOSIS — Z87891 Personal history of nicotine dependence: Secondary | ICD-10-CM

## 2022-09-04 DIAGNOSIS — O43113 Circumvallate placenta, third trimester: Secondary | ICD-10-CM | POA: Diagnosis present

## 2022-09-04 LAB — TYPE AND SCREEN
ABO/RH(D): O POS
Antibody Screen: NEGATIVE

## 2022-09-04 LAB — CBC
HCT: 35.4 % — ABNORMAL LOW (ref 36.0–46.0)
Hemoglobin: 11.6 g/dL — ABNORMAL LOW (ref 12.0–15.0)
MCH: 28.2 pg (ref 26.0–34.0)
MCHC: 32.8 g/dL (ref 30.0–36.0)
MCV: 86.1 fL (ref 80.0–100.0)
Platelets: 229 10*3/uL (ref 150–400)
RBC: 4.11 MIL/uL (ref 3.87–5.11)
RDW: 13.3 % (ref 11.5–15.5)
WBC: 10 10*3/uL (ref 4.0–10.5)
nRBC: 0 % (ref 0.0–0.2)

## 2022-09-04 MED ORDER — IBUPROFEN 600 MG PO TABS
600.0000 mg | ORAL_TABLET | Freq: Four times a day (QID) | ORAL | Status: DC
  Administered 2022-09-04 – 2022-09-05 (×4): 600 mg via ORAL
  Filled 2022-09-04 (×4): qty 1

## 2022-09-04 MED ORDER — LACTATED RINGERS IV SOLN: 500.0000 mL | INTRAVENOUS | Status: DC | PRN

## 2022-09-04 MED ORDER — SOD CITRATE-CITRIC ACID 500-334 MG/5ML PO SOLN: 30.0000 mL | ORAL | Status: DC | PRN

## 2022-09-04 MED ORDER — LACTATED RINGERS IV SOLN
500.0000 mL | Freq: Once | INTRAVENOUS | Status: AC
  Administered 2022-09-04: 500 mL via INTRAVENOUS

## 2022-09-04 MED ORDER — ONDANSETRON HCL 4 MG/2ML IJ SOLN: 4.0000 mg | Freq: Four times a day (QID) | INTRAMUSCULAR | Status: DC | PRN

## 2022-09-04 MED ORDER — FENTANYL-BUPIVACAINE-NACL 0.5-0.125-0.9 MG/250ML-% EP SOLN
12.0000 mL/h | EPIDURAL | Status: DC | PRN
  Administered 2022-09-04: 12 mL/h via EPIDURAL

## 2022-09-04 MED ORDER — ONDANSETRON HCL 4 MG PO TABS: 4.0000 mg | ORAL_TABLET | ORAL | Status: DC | PRN

## 2022-09-04 MED ORDER — OXYTOCIN BOLUS FROM INFUSION
333.0000 mL | Freq: Once | INTRAVENOUS | Status: AC
  Administered 2022-09-04: 333 mL via INTRAVENOUS

## 2022-09-04 MED ORDER — LIDOCAINE HCL (PF) 1 % IJ SOLN
INTRAMUSCULAR | Status: DC | PRN
  Administered 2022-09-04: 3 mL via SUBCUTANEOUS
  Administered 2022-09-04: 2 mL via SUBCUTANEOUS

## 2022-09-04 MED ORDER — ONDANSETRON HCL 4 MG/2ML IJ SOLN: 4.0000 mg | INTRAMUSCULAR | Status: DC | PRN

## 2022-09-04 MED ORDER — OXYTOCIN-SODIUM CHLORIDE 30-0.9 UT/500ML-% IV SOLN
1.0000 m[IU]/min | INTRAVENOUS | Status: DC
  Administered 2022-09-04: 1 m[IU]/min via INTRAVENOUS

## 2022-09-04 MED ORDER — ZOLPIDEM TARTRATE 5 MG PO TABS: 5.0000 mg | ORAL_TABLET | Freq: Every evening | ORAL | Status: DC | PRN

## 2022-09-04 MED ORDER — EPHEDRINE 5 MG/ML INJ: 10.0000 mg | INTRAVENOUS | Status: DC | PRN

## 2022-09-04 MED ORDER — AMMONIA AROMATIC IN INHA
RESPIRATORY_TRACT | Status: AC
  Filled 2022-09-04: qty 10

## 2022-09-04 MED ORDER — PRENATAL MULTIVITAMIN CH
1.0000 | ORAL_TABLET | Freq: Every day | ORAL | Status: DC
  Administered 2022-09-05: 1 via ORAL
  Filled 2022-09-04: qty 1

## 2022-09-04 MED ORDER — FENTANYL-BUPIVACAINE-NACL 0.5-0.125-0.9 MG/250ML-% EP SOLN
EPIDURAL | Status: AC
  Filled 2022-09-04: qty 250

## 2022-09-04 MED ORDER — LACTATED RINGERS IV SOLN: INTRAVENOUS | Status: DC

## 2022-09-04 MED ORDER — OXYTOCIN-SODIUM CHLORIDE 30-0.9 UT/500ML-% IV SOLN: 2.5000 [IU]/h | INTRAVENOUS | Status: DC

## 2022-09-04 MED ORDER — DIPHENHYDRAMINE HCL 50 MG/ML IJ SOLN: 12.5000 mg | INTRAMUSCULAR | Status: DC | PRN

## 2022-09-04 MED ORDER — BENZOCAINE-MENTHOL 20-0.5 % EX AERO
1.0000 | INHALATION_SPRAY | CUTANEOUS | Status: DC | PRN
  Administered 2022-09-04: 1 via TOPICAL
  Filled 2022-09-04: qty 56

## 2022-09-04 MED ORDER — DIPHENHYDRAMINE HCL 25 MG PO CAPS: 25.0000 mg | ORAL_CAPSULE | Freq: Four times a day (QID) | ORAL | Status: DC | PRN

## 2022-09-04 MED ORDER — SIMETHICONE 80 MG PO CHEW: 80.0000 mg | CHEWABLE_TABLET | ORAL | Status: DC | PRN

## 2022-09-04 MED ORDER — LIDOCAINE-EPINEPHRINE (PF) 1.5 %-1:200000 IJ SOLN
INTRAMUSCULAR | Status: DC | PRN
  Administered 2022-09-04: 3 mL via EPIDURAL

## 2022-09-04 MED ORDER — SODIUM CHLORIDE 0.9 % IV SOLN
INTRAVENOUS | Status: DC | PRN
  Administered 2022-09-04 (×2): 5 mL via EPIDURAL

## 2022-09-04 MED ORDER — SENNOSIDES-DOCUSATE SODIUM 8.6-50 MG PO TABS
2.0000 | ORAL_TABLET | Freq: Every day | ORAL | Status: DC
  Administered 2022-09-05: 2 via ORAL
  Filled 2022-09-04: qty 2

## 2022-09-04 MED ORDER — LIDOCAINE HCL (PF) 1 % IJ SOLN
INTRAMUSCULAR | Status: AC
  Filled 2022-09-04: qty 30

## 2022-09-04 MED ORDER — OXYCODONE-ACETAMINOPHEN 5-325 MG PO TABS: 2.0000 | ORAL_TABLET | ORAL | Status: DC | PRN

## 2022-09-04 MED ORDER — PHENYLEPHRINE 80 MCG/ML (10ML) SYRINGE FOR IV PUSH (FOR BLOOD PRESSURE SUPPORT): 80.0000 ug | PREFILLED_SYRINGE | INTRAVENOUS | Status: DC | PRN

## 2022-09-04 MED ORDER — TERBUTALINE SULFATE 1 MG/ML IJ SOLN: 0.2500 mg | Freq: Once | INTRAMUSCULAR | Status: DC | PRN

## 2022-09-04 MED ORDER — WITCH HAZEL-GLYCERIN EX PADS
1.0000 | MEDICATED_PAD | CUTANEOUS | Status: DC | PRN
  Administered 2022-09-04: 1 via TOPICAL
  Filled 2022-09-04: qty 100

## 2022-09-04 MED ORDER — OXYCODONE-ACETAMINOPHEN 5-325 MG PO TABS: 1.0000 | ORAL_TABLET | ORAL | Status: DC | PRN

## 2022-09-04 MED ORDER — DIBUCAINE (PERIANAL) 1 % EX OINT
1.0000 | TOPICAL_OINTMENT | CUTANEOUS | Status: DC | PRN
  Administered 2022-09-04: 1 via RECTAL
  Filled 2022-09-04: qty 28

## 2022-09-04 MED ORDER — OXYTOCIN-SODIUM CHLORIDE 30-0.9 UT/500ML-% IV SOLN
INTRAVENOUS | Status: AC
  Filled 2022-09-04: qty 500

## 2022-09-04 MED ORDER — MISOPROSTOL 200 MCG PO TABS
ORAL_TABLET | ORAL | Status: AC
  Filled 2022-09-04: qty 4

## 2022-09-04 MED ORDER — MISOPROSTOL 25 MCG QUARTER TABLET
25.0000 ug | ORAL_TABLET | ORAL | Status: DC
  Administered 2022-09-04: 25 ug via ORAL
  Filled 2022-09-04 (×2): qty 1

## 2022-09-04 MED ORDER — ACETAMINOPHEN 325 MG PO TABS
650.0000 mg | ORAL_TABLET | ORAL | Status: DC | PRN
  Administered 2022-09-04: 650 mg via ORAL
  Filled 2022-09-04: qty 2

## 2022-09-04 MED ORDER — ACETAMINOPHEN 325 MG PO TABS: 650.0000 mg | ORAL_TABLET | ORAL | Status: DC | PRN

## 2022-09-04 MED ORDER — OXYTOCIN 10 UNIT/ML IJ SOLN
INTRAMUSCULAR | Status: AC
  Filled 2022-09-04: qty 2

## 2022-09-04 MED ORDER — TETANUS-DIPHTH-ACELL PERTUSSIS 5-2.5-18.5 LF-MCG/0.5 IM SUSY
0.5000 mL | PREFILLED_SYRINGE | Freq: Once | INTRAMUSCULAR | Status: DC
  Filled 2022-09-04: qty 0.5

## 2022-09-04 MED ORDER — LIDOCAINE HCL (PF) 1 % IJ SOLN: 30.0000 mL | INTRAMUSCULAR | Status: DC | PRN

## 2022-09-04 MED ORDER — COCONUT OIL OIL: 1.0000 | TOPICAL_OIL | Status: DC | PRN

## 2022-09-04 MED ORDER — MISOPROSTOL 25 MCG QUARTER TABLET
25.0000 ug | ORAL_TABLET | ORAL | Status: DC
  Administered 2022-09-04: 25 ug via VAGINAL
  Filled 2022-09-04 (×2): qty 1

## 2022-09-04 NOTE — H&P (Signed)
OB History & Physical   History of Present Illness:  Chief Complaint:   HPI:  Angela Mcgrath is a 27 y.o. [phone] female at [redacted]w[redacted]d dated by LMP.  She presents to L&D for elective IOL   She reports:  -active fetal movement -no leakage of fluid -no vaginal bleeding -no contractions  Pregnancy Issues: BMI 40 or more - Obesity Class III  IUGR - 08/16/22: MFM u/s EFW 13% Circumvallate placenta seen @ anatomy [facility] H/o mental health diagnoses: _Depression__   Maternal Medical History:   Past Medical History:  Diagnosis Date   Anxiety    Depression    Family history of Downs syndrome    FOB-brother deceased   IBS (irritable bowel syndrome)    Increased BMI     Past Surgical History:  Procedure Laterality Date   NO PAST SURGERIES      Allergies  Allergen Reactions   Bee Venom Anaphylaxis    Prior to Admission medications   Medication Sig Start Date End Date Taking? Authorizing Provider  prenatal vitamin w/FE, FA (NATACHEW) 29-1 MG CHEW chewable tablet Chew 1 tablet by mouth daily at 12 noon.   Yes [provider]  metoCLOPramide (REGLAN) 10 MG tablet Take 1 tablet (10 mg total) by mouth 2 (two) times daily as needed for nausea (headache). 03/24/22 03/24/23  Dionne Bucy, MD     Prenatal care site: [facility]   Social History: She  reports that she has quit smoking. Her smoking use included cigarettes. She has never used smokeless tobacco. She reports that she does not drink alcohol and does not use drugs.  Family History: family history includes ADD / ADHD in her brother; Asthma in her father; COPD in her father and maternal grandfather; Diabetes in her maternal grandfather, maternal grandmother, and mother; Healthy in her sister; Heart attack in her mother; Heart disease in her maternal grandmother; Hypertension in her father; Schizophrenia in her brother; Stroke in her father.   Review of Systems: A full review of systems was performed and  negative except as noted in the HPI.    Physical Exam:  Vital Signs: BP 120/71 (BP Location: Left Arm)   Pulse 98   Temp 98.4 F (36.9 C) (Oral)   Resp 17   Ht 5' (1.524 m)   Wt 102.5 kg   LMP 12/03/2021 (Exact Date)   BMI 44.14 kg/m   General:   alert and cooperative    Results for orders placed or performed during the hospital encounter of 09/04/22 (from the past 24 hour(s))  CBC     Status: Abnormal   Collection Time: 09/04/22  6:29 AM  Result Value Ref Range   WBC 10.0 4.0 - 10.5 K/uL   RBC 4.11 3.87 - 5.11 MIL/uL   Hemoglobin 11.6 (L) 12.0 - 15.0 g/dL   HCT 82.9 (L) 56.2 - 13.0 %   MCV 86.1 80.0 - 100.0 fL   MCH 28.2 26.0 - 34.0 pg   MCHC 32.8 30.0 - 36.0 g/dL   RDW 86.5 78.4 - 69.6 %   Platelets 229 150 - 400 K/uL   nRBC 0.0 0.0 - 0.2 %  Type and screen     Status: None   Collection Time: 09/04/22  6:29 AM  Result Value Ref Range   ABO/RH(D) O POS    Antibody Screen NEG    Sample Expiration      09/07/2022,2359 Performed at [facility], [address], [place], [place] [address]  Pertinent Results:  Prenatal Labs: Blood type/Rh O pos  Antibody screen neg  Rubella Immune  Varicella Immune  RPR NR  HBsAg Neg  HIV NR  GC neg  Chlamydia neg  Genetic screening negative  1 hour GTT 129  3 hour GTT    GBS Neg   FHT: FHR: 120 bpm, variability: moderate,  accelerations:  Present,  decelerations:  Absent Category/reactivity:  Category I TOCO: irregular SVE:   /   /       Assessment:  Angela Mcgrath is a 27 y.o. [id number] female at [redacted]w[redacted]d here for elective IOL   Plan:  1. Admit to Labor & Delivery; consents reviewed and obtained  2. Fetal Well being  - Fetal Tracing: Cat I - GBS neg - Presentation: vtx confirmed by sve   3. Routine OB: - Prenatal labs reviewed, as above - Rh pos - CBC & T&S on admit - Clear fluids, IVF  4. Induction of Labor -  Contractions by external toco in place -  Pelvis proven to 3430g -  Plan for  induction with cytotec -  Plan for continuous fetal monitoring  -  Maternal pain control as desired: IVPM, nitrous, regional anesthesia - Anticipate vaginal delivery  5. Post Partum Planning: - Infant feeding: Formula - Contraception: BTL or IUD - Tdap not done AP  Doriann Zuch, CNM 09/04/2022 7:31 AM

## 2022-09-04 NOTE — Anesthesia Procedure Notes (Signed)
Epidural Patient location during procedure: OB Start time: 09/04/2022 2:55 PM End time: 09/04/2022 3:12 PM  Staffing Anesthesiologist: Lenard Simmer, MD Performed: anesthesiologist   Preanesthetic Checklist Completed: patient identified, IV checked, site marked, risks and benefits discussed, surgical consent, monitors and equipment checked, pre-op evaluation and timeout performed  Epidural Patient position: sitting Prep: ChloraPrep Patient monitoring: heart rate, continuous pulse ox and blood pressure Approach: midline Location: L3-L4 Injection technique: LOR saline  Needle:  Needle type: Tuohy  Needle gauge: 17 G Needle length: 9 cm Needle insertion depth: 5.5 cm Catheter type: closed end flexible Catheter size: 19 Gauge Catheter at skin depth: 10.5 cm Test dose: negative and 1.5% lidocaine with Epi 1:200 K  Assessment Sensory level: T10 Events: blood not aspirated, no cerebrospinal fluid, injection not painful, no injection resistance, no paresthesia and negative IV test  Additional Notes 1st attempt Pt. Evaluated and documentation done after procedure finished. Patient identified. Risks/Benefits/Options discussed with patient including but not limited to bleeding, infection, nerve damage, paralysis, failed block, incomplete pain control, headache, blood pressure changes, nausea, vomiting, reactions to medication both or allergic, itching and postpartum back pain. Confirmed with bedside nurse the patient's most recent platelet count. Confirmed with patient that they are not currently taking any anticoagulation, have any bleeding history or any family history of bleeding disorders. Patient expressed understanding and wished to proceed. All questions were answered. Sterile technique was used throughout the entire procedure. Please see nursing notes for vital signs. Test dose was given through epidural catheter and negative prior to continuing to dose epidural or start infusion.  Warning signs of high block given to the patient including shortness of breath, tingling/numbness in hands, complete motor block, or any concerning symptoms with instructions to call for help. Patient was given instructions on fall risk and not to get out of bed. All questions and concerns addressed with instructions to call with any issues or inadequate analgesia.    Patient tolerated the insertion well without immediate complications.Reason for block:procedure for pain

## 2022-09-04 NOTE — Progress Notes (Addendum)
Labor Progress Note  Angela Mcgrath is a 27 y.o. [id number] at [redacted]w[redacted]d by LMP admitted for induction of labor due to IUGR.  Subjective: she reports her contractions are stronger  Objective: BP 120/71 (BP Location: Left Arm)   Pulse 98   Temp 98.3 F (36.8 C) (Oral)   Resp 16   Ht 5' (1.524 m)   Wt 102.5 kg   LMP 12/03/2021 (Exact Date)   BMI 44.14 kg/m  Notable VS details: reviewed  Fetal Assessment: FHT:  FHR: 130 bpm, variability: moderate,  accelerations:  Present,  decelerations:  Absent Category/reactivity:  Category I UC:   regular, every 3-4 minutes SVE:    Dilation: 3.5cm  Effacement: 50%  Station:  -2  Consistency: soft  Position: anterior  Membrane status:intact Amniotic color: n/a  Labs: Lab Results  Component Value Date   WBC 10.0 09/04/2022   HGB 11.6 (L) 09/04/2022   HCT 35.4 (L) 09/04/2022   MCV 86.1 09/04/2022   PLT 229 09/04/2022    Assessment / Plan: 27 year old G4P2012 at 105w2d here for IOL for IUGR - Dr. Jean Rosenthal updated on labor progress  Labor:  Received 1 dose vaginal/oral cytotec at 8am, good cervical change, can now begin pitocin titration Preeclampsia:  labs stable Fetal Wellbeing:  Category I Pain Control:   planning epidural I/D:   GBS negative, BOW intact Anticipated MOD:  NSVD  [name], CNM 09/04/2022, 12:02 PM

## 2022-09-04 NOTE — Progress Notes (Signed)
Labor Progress Note  Angela Mcgrath is a 27 y.o. [id number] at [redacted]w[redacted]d by LMP admitted for induction of labor due to IUGR.  Subjective: she is comfortable after her epidural  Objective: BP 119/75   Pulse 85   Temp 98.5 F (36.9 C) (Oral)   Resp 18   Ht 5' (1.524 m)   Wt 102.5 kg   LMP 12/03/2021 (Exact Date)   SpO2 98%   BMI 44.14 kg/m  Notable VS details: reviewed  Fetal Assessment: FHT:  FHR: 125 bpm, variability: moderate,  accelerations:  Present,  decelerations:  Absent Category/reactivity:  Category I UC:   regular, every 1.5-3 minutes, coupling and tripling present SVE:    Dilation: 5cm  Effacement: 50%  Station:  -2  Consistency: soft  Position: anterior  Membrane status:AROM @ 1612 Amniotic color: clear  Labs: Lab Results  Component Value Date   WBC 10.0 09/04/2022   HGB 11.6 (L) 09/04/2022   HCT 35.4 (L) 09/04/2022   MCV 86.1 09/04/2022   PLT 229 09/04/2022    Assessment / Plan: 27 year old G4P2012 at [redacted]w[redacted]d here for IOL for IUGR - Dr. Jean Rosenthal updated on labor progress  Labor:  8am: one dose vaginal/oral cytotec, 1300, pitocin started, pitocin currently at 2mu/min Preeclampsia:  labs stable Fetal Wellbeing:  Category I Pain Control:  Epidural I/D:   GBS negative, AROM @ 1612 Anticipated MOD:  NSVD  Angela Mcgrath, CNM 09/04/2022, 4:15 PM

## 2022-09-04 NOTE — Discharge Summary (Signed)
Obstetrical Discharge Summary  Patient Name: Angela Mcgrath DOB: 25-Sep-1995 MRN: [id number]  Date of Admission: 09/04/2022 Date of Delivery: 09/04/22 Delivered by: Donato Schultz, CNM Date of Discharge: 09/05/2022  Primary OB: [facility]  DGL:OVFIEPP'I last menstrual period was 12/03/2021 (exact date). EDC Estimated Date of Delivery: 09/09/22 Gestational Age at Delivery: [redacted]w[redacted]d   Antepartum complications:  BMI 40 or more - Obesity Class III  IUGR - 08/16/22: MFM u/s EFW 13% Circumvallate placenta seen @ anatomy US H/o mental health diagnoses: _Depression__ Admitting Diagnosis: IOL for IUGR Secondary Diagnosis: NSVD Patient Active Problem List   Diagnosis Date Noted   Encounter for elective induction of labor 09/04/2022   Major depressive disorder, recurrent episode, moderate (HCC) 07/15/2022   Generalized anxiety disorder 07/15/2022   Obesity in pregnancy 02/25/2022   Supervision of other normal pregnancy, antepartum 01/28/2022   Obesity 06/09/2021   NSVD (normal spontaneous vaginal delivery) 09/19/2017   Dysmenorrhea 07/16/2015   Vaginitis 10/03/2014    Augmentation: AROM, Pitocin, and Cytotec Complications: None Intrapartum complications/course: She was induced for IUGR and received 1 dose cytotec then pitocin and AROM. She quickly progressed to 10/100/+2 and pushed for 4 minutes, delivering viable female infant over intact perineum. Apgars 8/9. Date of Delivery: 09/04/22 Delivered By: Donato Schultz, CNM Delivery Type: spontaneous vaginal delivery Anesthesia: epidural Placenta: spontaneous Laceration: none Episiotomy: none Newborn Data: Live born child  Birth Weight: 5lb 15oz  APGAR: 8, 9  Newborn Delivery   Birth date/time: 09/04/2022 18:09:00 Delivery type: Vaginal, Spontaneous     Postpartum Procedures: none Edinburgh:     07/15/2022    3:02 PM 01/28/2022    3:39 PM  Edinburgh Postnatal Depression Scale Screening Tool  I have been able to laugh  and see the funny side of things. 2 0  I have looked forward with enjoyment to things. 3 0  I have blamed myself unnecessarily when things went wrong. 2 0  I have been anxious or worried for no good reason. 3 0  I have felt scared or panicky for no good reason. 2 0  Things have been getting on top of me. 3 1  I have been so unhappy that I have had difficulty sleeping. 0 0  I have felt sad or miserable. 2 0  I have been so unhappy that I have been crying. 3 0  The thought of harming myself has occurred to me. 1 0  Edinburgh Postnatal Depression Scale Total 21 1      Post partum course:   Patient had an uncomplicated postpartum course.  By time of discharge on PPD#1, her pain was controlled on oral pain medications; she had appropriate lochia and was ambulating, voiding without difficulty and tolerating regular diet.  She was deemed stable for discharge to home.    Discharge Physical Exam:   BP 112/76 (BP Location: Right Arm)   Pulse 66   Temp 98.1 F (36.7 C) (Oral)   Resp 18   Ht 5' (1.524 m)   Wt 102.5 kg   LMP 12/03/2021 (Exact Date)   SpO2 99%   Breastfeeding Unknown   BMI 44.14 kg/m   General: NAD CV: RRR Pulm: CTABL, nl effort ABD: s/nd/nt, fundus firm and below the umbilicus Lochia: moderate Perineum: intact DVT Evaluation: LE non-ttp, no evidence of DVT on exam.  Hemoglobin  Date Value Ref Range Status  09/05/2022 10.6 (L) 12.0 - 15.0 g/dL Final  95/18/8416 60.6 11.1 - 15.9 g/dL Final   HCT  Date  Value Ref Range Status  09/05/2022 31.8 (L) 36.0 - 46.0 % Final   Hematocrit  Date Value Ref Range Status  02/17/2022 42.2 34.0 - 46.6 % Final     Disposition: stable, discharge to home. Baby Feeding: formula Baby Disposition: home with mom  Rh Immune globulin given: n/a, O pos Rubella vaccine given: n/a, immune Varicella vaccine given: n/a, immune Tdap vaccine given in AP or PP setting: declined Flu vaccine given in AP or PP setting:  declined  Contraception: undecided, either OCPs or IUD. Advised to call the office by 2wks postpartum if desires an IUD to have it ready for her 6wk postpartum.  Prenatal Labs:  Blood type/Rh O pos  Antibody screen neg  Rubella Immune  Varicella Immune  RPR NR  HBsAg Neg  HIV NR  GC neg  Chlamydia neg  Genetic screening negative  1 hour GTT 129  3 hour GTT    GBS Neg     Plan:  Mikeya D Fitton was discharged to home in good condition. Follow-up appointment with delivering provider in 6 weeks.  Discharge Medications: Allergies as of 09/05/2022       Reactions   Bee Venom Anaphylaxis        Medication List     STOP taking these medications    metoCLOPramide 10 MG tablet Commonly known as: REGLAN       TAKE these medications    acetaminophen 325 MG tablet Commonly known as: Tylenol Take 2 tablets (650 mg total) by mouth every 6 (six) hours as needed for fever or mild pain (for pain scale < 4).   benzocaine-Menthol 20-0.5 % Aero Commonly known as: DERMOPLAST Apply 1 Application topically as needed for irritation (perineal discomfort).   ibuprofen 600 MG tablet Commonly known as: ADVIL Take 1 tablet (600 mg total) by mouth every 6 (six) hours as needed for fever, cramping or mild pain.   prenatal vitamin w/FE, FA 29-1 MG Chew chewable tablet Chew 1 tablet by mouth daily at 12 noon.   witch hazel-glycerin pad Commonly known as: TUCKS Apply 1 Application topically as needed for hemorrhoids.         Follow-up Information     [name], CNM Follow up in 2 week(s).   Specialty: Certified Nurse Midwife Why: 2wk mood check Contact information: [address] [place] [address] [phone]         [name], CNM Follow up in 6 week(s).   Specialty: Certified Nurse Midwife Why: 6wk postpartum Contact information: [address] [place] [address] [phone]                  Signed:  Blanchard Kelch 09/05/2022 12:28 PM

## 2022-09-04 NOTE — Anesthesia Preprocedure Evaluation (Signed)
Anesthesia Evaluation  Patient identified by MRN, date of birth, ID band Patient awake    Reviewed: Allergy & Precautions, H&P , NPO status , Patient's Chart, lab work & pertinent test results  History of Anesthesia Complications Negative for: history of anesthetic complications  Airway Mallampati: II  TM Distance: >3 FB Neck ROM: full    Dental no notable dental hx.    Pulmonary neg shortness of breath, neg recent URI, former smoker          Cardiovascular Exercise Tolerance: Good (-) hypertensionnegative cardio ROS      Neuro/Psych  PSYCHIATRIC DISORDERS Anxiety Depression    negative neurological ROS     GI/Hepatic ,GERD  ,,  Endo/Other  neg diabetes  Morbid obesity  Renal/GU   negative genitourinary   Musculoskeletal   Abdominal   Peds  Hematology negative hematology ROS (+)   Anesthesia Other Findings Past Medical History: No date: Anxiety No date: Depression No date: Family history of Downs syndrome     Comment:  FOB-brother deceased No date: IBS (irritable bowel syndrome) No date: Increased BMI  Past Surgical History: No date: NO PAST SURGERIES No date: none  BMI    Body Mass Index:  40.04 kg/m      Reproductive/Obstetrics (+) Pregnancy                             Anesthesia Physical Anesthesia Plan  ASA: 3  Anesthesia Plan: Epidural   Post-op Pain Management:    Induction:   PONV Risk Score and Plan:   Airway Management Planned:   Additional Equipment:   Intra-op Plan:   Post-operative Plan:   Informed Consent: I have reviewed the patients History and Physical, chart, labs and discussed the procedure including the risks, benefits and alternatives for the proposed anesthesia with the patient or authorized representative who has indicated his/her understanding and acceptance.       Plan Discussed with: Anesthesiologist  Anesthesia Plan Comments:          Anesthesia Quick Evaluation

## 2022-09-05 LAB — CBC
HCT: 31.8 % — ABNORMAL LOW (ref 36.0–46.0)
Hemoglobin: 10.6 g/dL — ABNORMAL LOW (ref 12.0–15.0)
MCH: 28.5 pg (ref 26.0–34.0)
MCHC: 33.3 g/dL (ref 30.0–36.0)
MCV: 85.5 fL (ref 80.0–100.0)
Platelets: 199 10*3/uL (ref 150–400)
RBC: 3.72 MIL/uL — ABNORMAL LOW (ref 3.87–5.11)
RDW: 13.6 % (ref 11.5–15.5)
WBC: 10.9 10*3/uL — ABNORMAL HIGH (ref 4.0–10.5)
nRBC: 0 % (ref 0.0–0.2)

## 2022-09-05 LAB — RPR: RPR Ser Ql: NONREACTIVE

## 2022-09-05 MED ORDER — IBUPROFEN 600 MG PO TABS: 600.0000 mg | ORAL_TABLET | Freq: Four times a day (QID) | ORAL | 0 refills | Status: AC | PRN

## 2022-09-05 MED ORDER — BENZOCAINE-MENTHOL 20-0.5 % EX AERO: 1.0000 | INHALATION_SPRAY | CUTANEOUS | 0 refills | Status: AC | PRN

## 2022-09-05 MED ORDER — WITCH HAZEL-GLYCERIN EX PADS: 1.0000 | MEDICATED_PAD | CUTANEOUS | 0 refills | Status: AC | PRN

## 2022-09-05 MED ORDER — ACETAMINOPHEN 325 MG PO TABS: 650.0000 mg | ORAL_TABLET | Freq: Four times a day (QID) | ORAL | 0 refills | Status: AC | PRN

## 2022-09-05 NOTE — Anesthesia Postprocedure Evaluation (Signed)
Anesthesia Post Note  Patient: Angela Mcgrath  Procedure(s) Performed: AN AD HOC LABOR EPIDURAL  Patient location during evaluation: Mother Baby Anesthesia Type: Epidural Level of consciousness: awake and alert Pain management: pain level controlled Vital Signs Assessment: post-procedure vital signs reviewed and stable Respiratory status: spontaneous breathing, nonlabored ventilation and respiratory function stable Cardiovascular status: stable Postop Assessment: no headache, no backache and epidural receding Anesthetic complications: no  No notable events documented.   Last Vitals:  Vitals:   09/05/22 0303 09/05/22 0824  BP: 106/75 112/76  Pulse: 70 66  Resp: 18 18  Temp: 36.8 C 36.7 C  SpO2: 100% 99%    Last Pain:  Vitals:   09/05/22 0824  TempSrc: Oral  PainSc:                  Stephanie Coup

## 2022-09-05 NOTE — Progress Notes (Signed)
D/C home to self care/verb understanding of d/c instructions 

## 2022-09-05 NOTE — Anesthesia Post-op Follow-up Note (Signed)
  Anesthesia Pain Follow-up Note  Patient: Angela Mcgrath  Day #: 1  Date of Follow-up: 09/05/2022 Time: 8:50 AM  Last Vitals:  Vitals:   09/05/22 0303 09/05/22 0824  BP: 106/75 112/76  Pulse: 70 66  Resp: 18 18  Temp: 36.8 C 36.7 C  SpO2: 100% 99%    Level of Consciousness: alert  Pain: none   Side Effects:None  Catheter Site Exam:clean, dry, no drainage     Plan: D/C from anesthesia care at surgeon's request  Stephanie Coup

## 2022-09-07 ENCOUNTER — Telehealth: Payer: Self-pay

## 2022-09-07 NOTE — Telephone Encounter (Signed)
[facility]- Discharge Call Backs-Left Voicemail about the following below. 1-Do you have any questions or concerns about yourself as you heal? 2-Any concerns or questions about your baby? Is your baby eating, peeing,pooping well? 3-Where does your baby sleep in your home? Reviewed ABC's of safe sleep. 4-How was your stay at the hospital? 5- Did our team work together to care for you? You should be receiving a survey in the mail soon.   We would really appreciate it if you could fill that out for us and return it in the mail.  We value the feedback to make improvements and continue the great work we do.   If you have any questions please feel free to call me back at [phone]  

## 2022-09-09 ENCOUNTER — Inpatient Hospital Stay: Admit: 2022-09-09 | Payer: Self-pay

## 2022-09-20 ENCOUNTER — Ambulatory Visit: Payer: Medicaid Other | Admitting: Licensed Clinical Social Worker

## 2022-09-28 ENCOUNTER — Ambulatory Visit: Payer: Medicaid Other | Admitting: Licensed Clinical Social Worker

## 2022-09-28 NOTE — Progress Notes (Unsigned)
Counselor/Therapist Progress Note  Patient ID: Angela Mcgrath, MRN: [id number],    Date: 09/28/2022  Time Spent: ***   Treatment Type: Psychotherapy  Reported Symptoms: {CHL AMB Reported [phone]}  Mental Status Exam:  Appearance:   {PSY:22683}     Behavior:  {PSY:21022743}  Motor:  {PSY:22302}  Speech/Language:   {PSY:22685}  Affect:  {PSY:22687}  Mood:  {PSY:31886}  Thought process:  {PSY:31888}  Thought content:    {[phone]}  Sensory/Perceptual disturbances:    {[phone]}  Orientation:  {PSY:30297}  Attention:  {PSY:22877}  Concentration:  {PSY:519 797 1869}  Memory:  {[phone]}  Fund of knowledge:   {PSY:519 797 1869}  Insight:    {PSY:519 797 1869}  Judgment:   {PSY:519 797 1869}  Impulse Control:  {PSY:519 797 1869}   Risk Assessment: Danger to Self:  No Self-injurious Behavior: No Danger to Others: No Duty to Warn:no Physical Aggression / Violence:No  Access to Firearms a concern: No  Gang Involvement:No   Subjective: Patient was engaged and cooperative throughout the session using time effectively to discuss   Patient voices continued motivation for treatment and understanding of  . Patient is likely to benefit from future treatment because  remains motivated to decrease  And   and reports benefit of regular sessions in addressing these symptoms.     Interventions: Cognitive Behavioral Therapy and Client Centered  Established psychological safety. LCSW met with patient to identify needs related to stressors and functioning, and assess and monitor for signs and symptoms of and , and assess safety. Checked in with patient regarding their week and processed with the patient how they have been doing since the last follow-up session. LCSW assisted patient in processing their thoughts and emotions regarding *** LCSW *** Provided support through active listening, validation of feelings, and highlighted patient's strengths.   Diagnosis:No diagnosis  found.  Plan: Patient's goal of treatment is to mentally think better.     Treatment Target: Understand the relationship between thoughts, emotions, and behaviors  Psychoeducation on CBTs model   Oriented the client to the therapeutic approach Teach the connection between thoughts, emotions, and behaviors  Treatment Target: Increase realistic balanced thinking -to learn how to replace thinking with thoughts that are more accurate or helpful Explore patient's thoughts, beliefs, automatic thoughts, assumptions  Identify and replace unhelpful thinking patterns (upsetting ideas, self-talk and mental images) Process distress and allow for emotional release  Questioning and challenging thoughts Cognitive reappraisal  Restructuring, Socratic questioning  Treatment Target: Reducing vulnerability to "emotional mind" Values clarification   Self-care - nutrition, sleep, exercise  Mindfulness skills Treatment Target: Increase emotional regulation  Coping skills to manage mood symptoms - anger   Teach distress tolerance techniques - "what helps me"  Opposite action PLEASE  skills or self-care skills  Self-soothing  Cope ahead skills - imagery, rehearsal, problem-solving, exposure   Interpersonal Effectiveness   Future Appointments  Date Time Provider Department Center  09/28/2022  2:00 PM Kathreen Cosier, LCSW [facility] None    Kathreen Cosier, [place]

## 2022-10-05 ENCOUNTER — Ambulatory Visit: Payer: Medicaid Other | Admitting: Licensed Clinical Social Worker

## 2022-10-05 DIAGNOSIS — F331 Major depressive disorder, recurrent, moderate: Secondary | ICD-10-CM

## 2022-10-05 DIAGNOSIS — F411 Generalized anxiety disorder: Secondary | ICD-10-CM

## 2022-10-05 NOTE — Progress Notes (Signed)
TELEHEALTH VIRTUAL MOOD VISIT ENCOUNTER NOTE  I connected with Angela Mcgrath on 10/05/22 by telephone and verified that I am speaking with the correct person using two identifiers.   I discussed the limitations, risks, security and privacy concerns of performing an evaluation and management service by telephone and the availability of in person appointments. I also discussed with the patient that there may be a patient responsible charge related to this service. The patient expressed understanding and agreed to proceed.  LCSW introduced self and established psychological safety.  Patient verbally consented to this telephonic session.   Patient is located at Home address.  LCSW located at Remote Work site   Time visit started: 3:10pm  Time visit ended: 3:41pm   SUBJECTIVE: Patient clinic or provider recommended a 2 week mood check due to risk factors for postpartum mood disorder.  Patient delivered on 09/04/2022. Patient presents reporting that she has been feeling very good both physcially and mentally.   ASSESSMENT: Patient reports feeling well, with an overall positive mood, although she experiences occasional low mood and anger related to the absence of her mother, who passed away several years ago. She also reports mild anxiety, including frequent checking on the baby when she makes noises and being easily awakened by the baby at night. About a week ago, she had an intrusive thought at work that the baby might fall from where she had placed her. While the patient has some difficulties with sleep, she is still able to sleep when baby sleeps at night, she is not able to sleep during the day due to having a toddler in the home. Additionally, she notes an increased appetite over the past week, feeling hungry constantly. Patient reports that she is back to work, has already gone on a date night with her boyfriend, and has had a friend watch baby and her toddler so she can have a break.    PRESENTING CONCERNS: Patient and/or family reports the following symptoms/concerns: No concerns reported.  Duration of problem: NA; Severity of problem:  NA  STRENGTHS (Protective Factors/Coping Skills): Patient reports supportive relationships, including her partner/father of baby, best friend, and grandmother. She also reports that she is doing "really well".    INTERVENTIONS: Interventions utilized:  Psychoeducation and/or Health Education Standardized Assessments completed: Not Needed  Conducted brief assessment  Provide brief psychoeducation on postpartum mood and anxiety disorders signs and symptoms, including information on Baby Blues, Postpartum Depression, and Postpartum Anxiety.   Discussed self-care strategies to prevent and/or reduce symptoms of postpartum mood and anxiety disorders, including sleep hygiene, eating regularly, drinking fluids, getting outside, and support from partner, family, and/or friends.   Informed patient to call her provider right away or get emergency help if she experiences any of the following symptoms: Feelings of hopelessness and total despair. Feeling out of touch with reality (hearing or seeing things other people don't). Feeling that you might hurt yourself or your baby.  PLAN:   Future Appointments  Date Time Provider Department Center  10/26/2022  1:00 PM Kathreen Cosier, LCSW [facility] None    Kathreen Cosier

## 2022-10-26 ENCOUNTER — Ambulatory Visit: Payer: Medicaid Other | Admitting: Licensed Clinical Social Worker

## 2022-10-26 DIAGNOSIS — F331 Major depressive disorder, recurrent, moderate: Secondary | ICD-10-CM

## 2022-10-26 DIAGNOSIS — F411 Generalized anxiety disorder: Secondary | ICD-10-CM

## 2022-10-26 NOTE — Progress Notes (Signed)
Counselor/Therapist Progress Note  Patient ID: LIBIA FYE, MRN: [id number],    Date: 10/26/2022  Time Spent: 34 minutes    Treatment Type: Psychotherapy  Reported Symptoms:  overall okay, moments of feeling overwhelm, frustration, and possible rage    Mental Status Exam:  Appearance:   NA     Behavior:  Appropriate, Sharing, and Motivated  Motor:  NA  Speech/Language:   Clear and Coherent and Normal Rate  Affect:  NA  Mood:  normal  Thought process:  normal  Thought content:    WNL  Sensory/Perceptual disturbances:    WNL  Orientation:  oriented to person, place, time/date, and situation  Attention:  Fair  Concentration:  Fair  Memory:  WNL  Fund of knowledge:   Good  Insight:    Fair  Judgment:   Fair  Impulse Control:  Fair   Risk Assessment: Danger to Self:  No Self-injurious Behavior: No Danger to Others: No Duty to Warn:no Physical Aggression / Violence:No  Access to Firearms a concern: No  Gang Involvement:No   Subjective: Patient was engaged and cooperative throughout the session using time effectively to discuss thoughts, feelings and coping skills. Patient reports overall her mood is good but has these moments that concerns her where she feels intense overwhelm and frustration about 1x per day to 4-5 times per week. Patient denies wanting to harm self or others and reports that she just sometimes talks to her school aged child in a way that is not consistent with her values. Patient was receptive to feedback and intervention from LCSW and has agreed to practice mindfulness. Patient voices continued motivation for treatment.   Interventions: Cognitive Behavioral Therapy and client centered  Established psychological safety. Checked in with patient and conducted brief assessment of patient's current symptoms and safety, discussed anxiety, trauma, bipolar disorder. LCSW charted out patient's experience of feelings of overwhelm. LCSW discussed sleep hygiene and the  importance of sufficient and consistent sleep schedule. LCSW provided psychoeducation on mindfulness meditation and encouraged patient to practice 3-5 minutes daily using the guided meditations that LCSW sent to patient via email. Patient reports agreement with this plan.  Provided support through active listening, validation of feelings, and highlighted patient's strengths.  Diagnosis:   ICD-10-CM   1. Major depressive disorder, recurrent episode, moderate (HCC)  F33.1     2. Generalized anxiety disorder  F41.1      Plan: Engage patient in mindfulness meditation practice.   Patient's goal of treatment is to mentally think better.     Treatment Target: Understand the relationship between thoughts, emotions, and behaviors  Psychoeducation on CBTs model   Oriented the client to the therapeutic approach Teach the connection between thoughts, emotions, and behaviors  Treatment Target: Increase realistic balanced thinking -to learn how to replace thinking with thoughts that are more accurate or helpful Explore patient's thoughts, beliefs, automatic thoughts, assumptions  Identify and replace unhelpful thinking patterns (upsetting ideas, self-talk and mental images) Process distress and allow for emotional release  Questioning and challenging thoughts Cognitive reappraisal  Restructuring, Socratic questioning  Treatment Target: Reducing vulnerability to "emotional mind" Values clarification   Self-care - nutrition, sleep, exercise  Mindfulness skills Treatment Target: Increase emotional regulation  Coping skills to manage mood symptoms - anger   Teach distress tolerance techniques - "what helps me"  Opposite action PLEASE  skills or self-care skills  Self-soothing  Cope ahead skills - imagery, rehearsal, problem-solving, exposure   Interpersonal Effectiveness    Future Appointments  Date Time Provider Department Center  11/09/2022  1:00 PM Kathreen Cosier, LCSW [facility] None    Kathreen Cosier, [place]

## 2022-11-09 ENCOUNTER — Ambulatory Visit: Payer: Medicaid Other | Admitting: Licensed Clinical Social Worker

## 2023-01-31 IMAGING — CT CT NECK W/ CM
4 of 6 series · 13 of 33 positions shown, 15 images · IV contrast (APPLIED)
Comparison: None.

CLINICAL DATA: Sore throat

EXAM:
CT NECK WITH CONTRAST
TECHNIQUE: Multidetector CT imaging of the neck was performed using the
standard protocol following the bolus administration of intravenous
contrast.

[Series 3: axial bone · axial · 0.44mm/px · z∈[-179,-111]mm · 2 of 104 slices shown]
[im 35/104  bone]
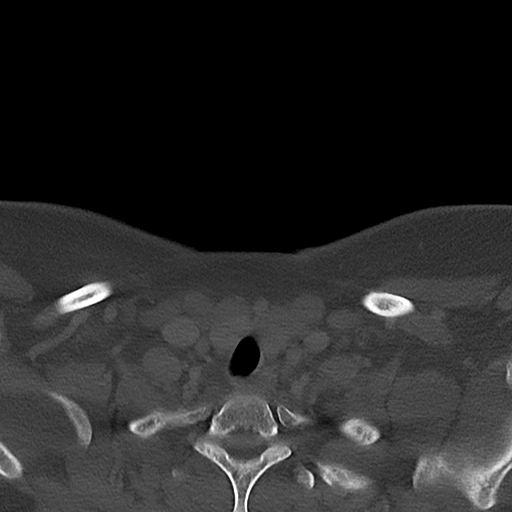
[im 69/104  bone]
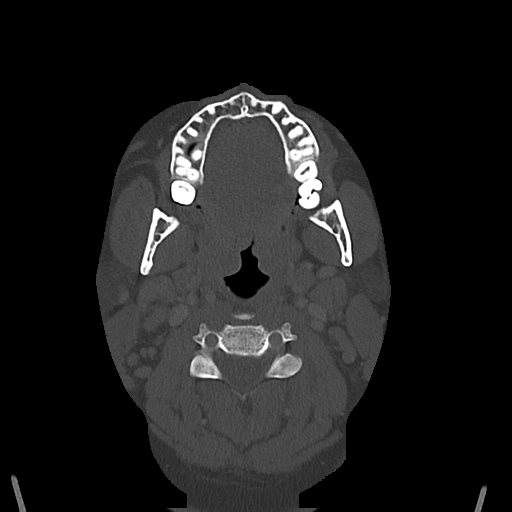

[Series 5: sag neck · sagittal · 0.45mm/px · 5 of 98 slices shown, 6 images]
[im 33/98  bone]
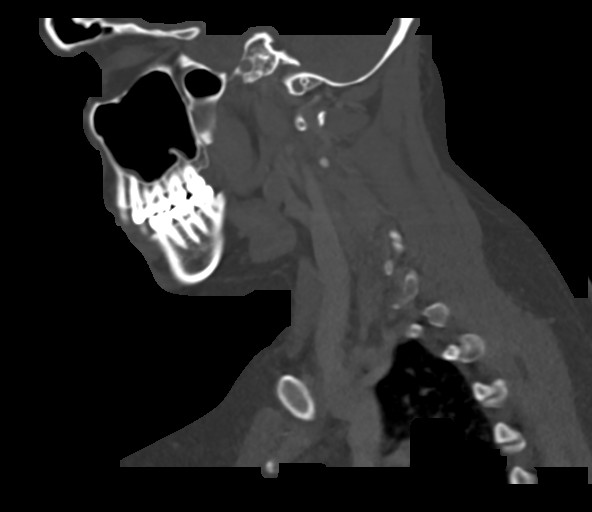
[im 41/98  bone]
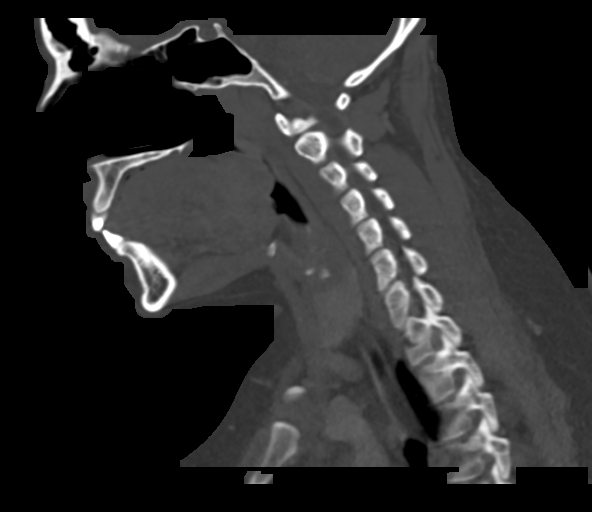
[im 49/98  soft-tissue]
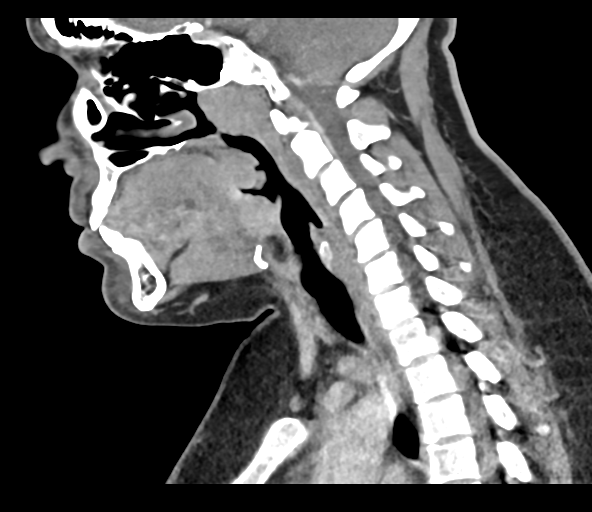
[im 49/98  bone]
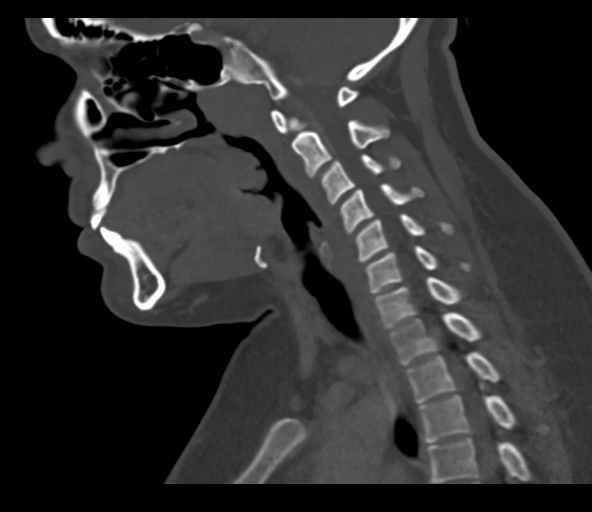
[im 57/98  bone]
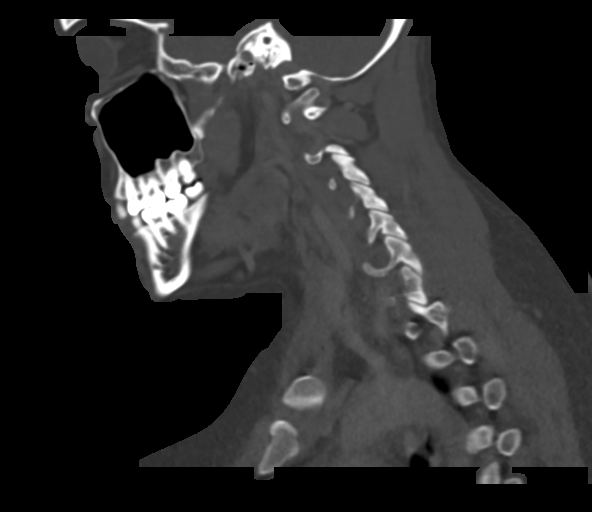
[im 65/98  bone]
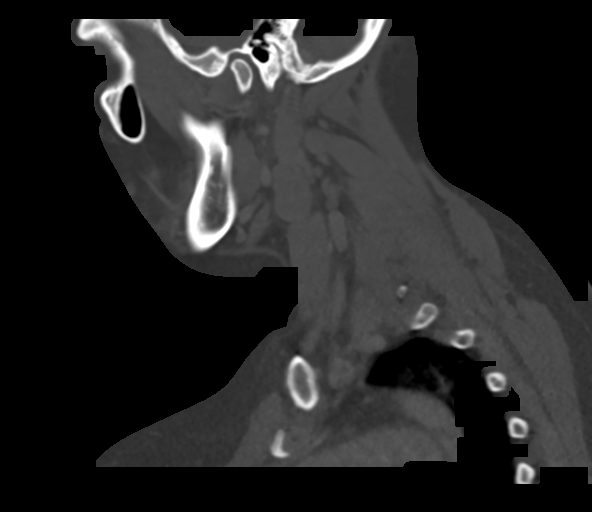

[Series 6: cor neck · coronal · 0.40mm/px · 3 of 141 slices shown]
[im 29/141  bone]
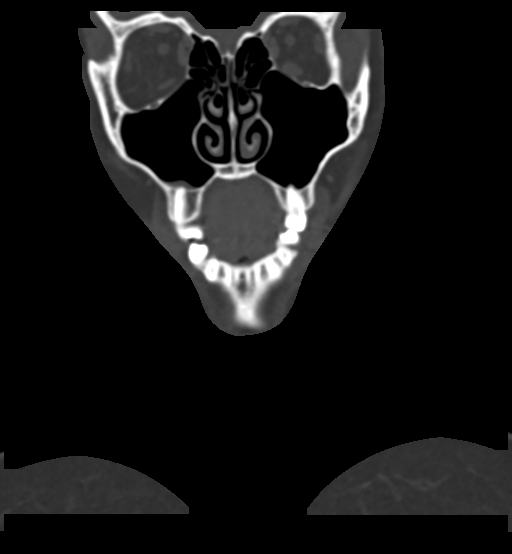
[im 57/141  bone]
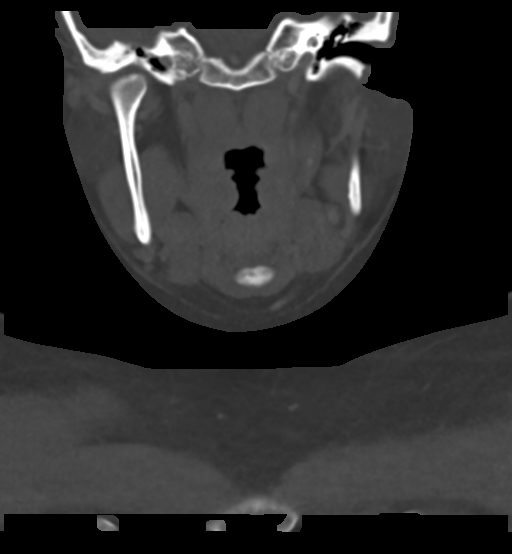
[im 85/141  bone]
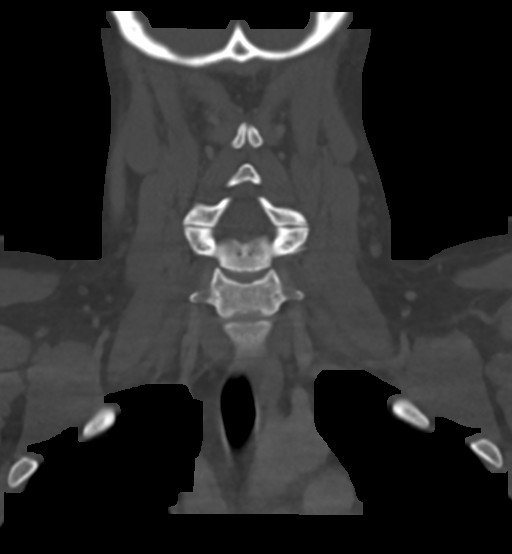

[Series 7: ax oropharynx · axial · 0.36mm/px · z∈[-250,-125]mm · 3 of 138 slices shown, 4 images]
[im 35/138  soft-tissue]
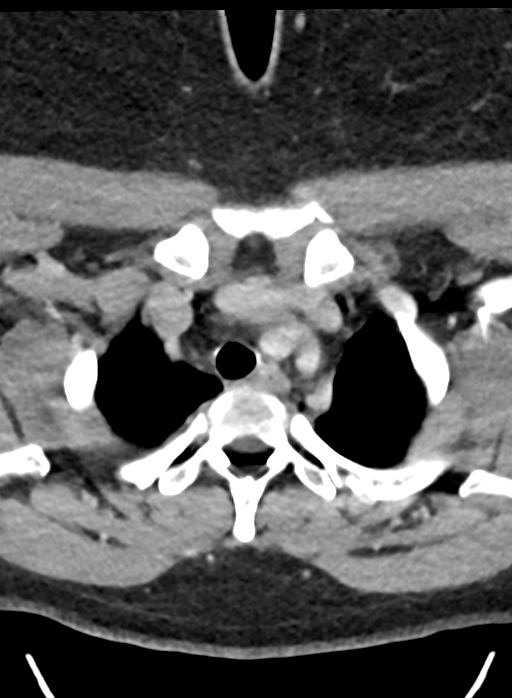
[im 35/138  bone]
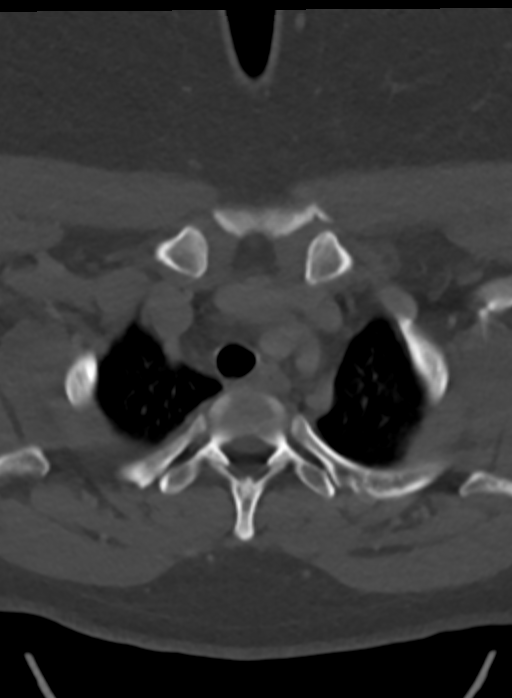
[im 69/138  bone]
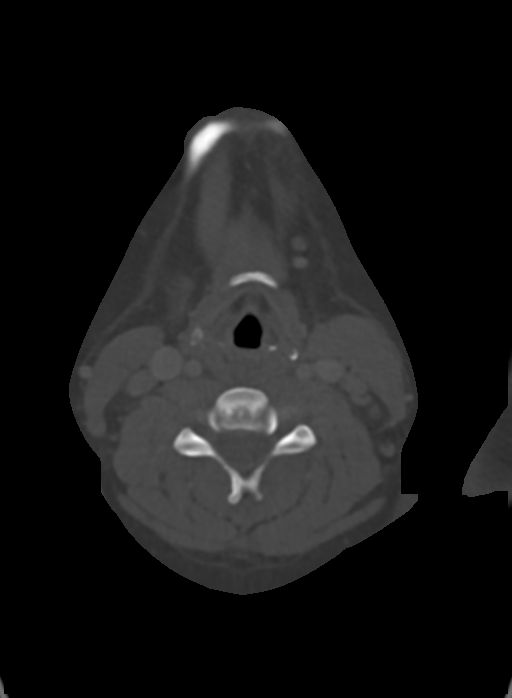
[im 103/138  bone]
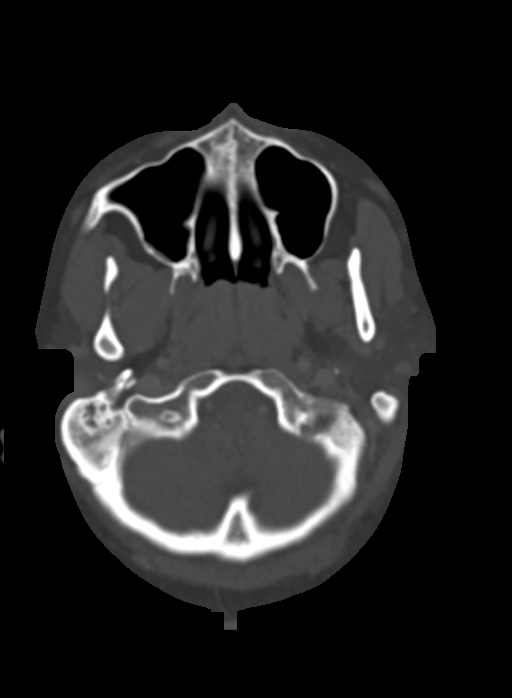

[13 of 33 positions shown; findings below may reference images not displayed]

RADIATION DOSE REDUCTION: This exam was performed according to the
departmental dose-optimization program which includes automated
exposure control, adjustment of the mA and/or kV according to
patient size and/or use of iterative reconstruction technique.

CONTRAST:  75mL OMNIPAQUE IOHEXOL 300 MG/ML  SOLN
FINDINGS: PHARYNX AND LARYNX: The nasopharynx, oropharynx and larynx are
normal. Visible portions of the oral cavity, tongue base and floor
of mouth are normal. Normal epiglottis, vallecula and pyriform
sinuses. The larynx is normal. No retropharyngeal abscess, effusion
or lymphadenopathy.

SALIVARY GLANDS: Normal parotid, submandibular and sublingual
glands.

THYROID: Normal.

LYMPH NODES: Bilateral enlarged lymph nodes at level 2A.

VASCULAR: Major cervical vessels are patent.

LIMITED INTRACRANIAL: Normal.

VISUALIZED ORBITS: Normal.

MASTOIDS AND VISUALIZED PARANASAL SINUSES: No fluid levels or
advanced mucosal thickening. No mastoid effusion.

SKELETON: No bony spinal canal stenosis. No lytic or blastic
lesions.

UPPER CHEST: Clear.

OTHER: None.
IMPRESSION: 1. No acute abnormality of the neck.
2. Reactive cervical lymph nodes.

## 2023-09-21 NOTE — Telephone Encounter (Signed)
 No Actions Required Documentation only Disposition:Office Today/Tomorrow Patient Agreed to Disposition     Reason for call: Patient is calling for: she was awakened by chest pain on Monday, 09/19/23. She states that pain was located under her right breast. The pain started at 5am and lasted until 3:40 pm on Monday.    Symptoms: Symptoms are: nausea this am symptoms are continuous symptoms began: today  pain level: denies denies: chest pain or shortness of breath today    Actions taken during call: Scheduled an Appointment.  Future Appointments     Date/Time Provider Department Center Visit Type   09/22/2023 1:45 PM (Arrive by 1:30 PM) Devine, Jaimie Ann, NP [facility] SAME DAY       Triage: Triage completed, care advice given per protocol. Call back parameters given and caller advised of 24 hour nurse triage. Instructed to seek immediate medical attention if new symptoms develop, current symptoms worsen or if you become increasingly concerned. Patient verbalized understanding.      Future Appointments     Date/Time Provider Department Center Visit Type   09/22/2023 1:45 PM (Arrive by 1:30 PM) Devine, Jaimie Ann, NP [facility] SAME DAY      ANGELA M BROWN, RN [facility] ([facility] Patient Engagement Center  Patient Engagement Center Documentation   Reason for Disposition . Patient wants to be seen  Additional Information . Negative: SEVERE difficulty breathing (e.g., struggling for each breath, speaks in single words) . Negative: Passed out (i.e., fainted, collapsed and was not responding) . Negative: Chest pain lasting longer than 5 minutes and ANY of the following:* Over 63 years old* Over 49 years old and at least one cardiac risk factor (i.e., high blood pressure, diabetes, high cholesterol, obesity, smoker or strong family history of heart disease)* Pain is crushing, pressure-like, or heavy * Took nitroglycerin and chest pain was  not relieved* History of heart disease (i.e., angina, heart attack, bypass surgery, angioplasty, CHF) . Negative: Visible sweat on face or sweat dripping down face . Negative: Sounds like a life-threatening emergency to the triager . Negative: Followed an injury to chest . Negative: SEVERE chest pain . Negative: Pain also present in shoulder(s) or arm(s) or jaw . Negative: Difficulty breathing . Negative: Cocaine use within last 3 days . Negative: History of prior 'blood clot' in leg or lungs (i.e., deep vein thrombosis, pulmonary embolism) . Negative: Recent illness requiring prolonged bed rest (i.e., immobilization) . Negative: Hip or leg fracture in past 2 months (e.g, or had cast on leg or ankle) . Negative: Major surgery in the past month . Negative: Recent long-distance travel with prolonged time in car, bus, plane, or train (i.e., within past 2 weeks; 6 or more hours duration) . Negative: Heart beating irregularly or very rapidly . Negative: Chest pain lasting longer than 5 minutes . Negative: Intermittent chest pain and pain has been increasing in severity or frequency . Negative: Dizziness or lightheadedness . Negative: Coughing up blood . Negative: Patient sounds very sick or weak to the triager . Negative: Fever > 100.5 F (38.1 C) . Negative: Intermittent chest pains persist > 3 days . Negative: All other patients with chest pain  Protocols used: Chest Pain-A-[place]

## 2023-09-22 ENCOUNTER — Other Ambulatory Visit: Payer: Self-pay | Admitting: Family

## 2023-09-22 DIAGNOSIS — R1011 Right upper quadrant pain: Secondary | ICD-10-CM

## 2023-09-22 DIAGNOSIS — R1013 Epigastric pain: Secondary | ICD-10-CM

## 2023-09-23 ENCOUNTER — Ambulatory Visit
Admission: RE | Admit: 2023-09-23 | Discharge: 2023-09-23 | Disposition: A | Discharge status: Home / Self Care | Source: Ambulatory Visit | Attending: Family | Admitting: Family

## 2023-09-23 DIAGNOSIS — R1011 Right upper quadrant pain: Secondary | ICD-10-CM | POA: Insufficient documentation

## 2023-09-23 DIAGNOSIS — R1013 Epigastric pain: Secondary | ICD-10-CM | POA: Insufficient documentation
